# Patient Record
Sex: Female | Born: 1995 | Race: Black or African American | Hispanic: No | Marital: Married | Smoking: Current some day smoker
Health system: Southern US, Community
[De-identification: ages and names within clinical notes are randomized; demographics above are authoritative.]

## PROBLEM LIST (undated history)

## (undated) DIAGNOSIS — J45909 Unspecified asthma, uncomplicated: Secondary | ICD-10-CM

## (undated) DIAGNOSIS — E0590 Thyrotoxicosis, unspecified without thyrotoxic crisis or storm: Secondary | ICD-10-CM

## (undated) DIAGNOSIS — E079 Disorder of thyroid, unspecified: Secondary | ICD-10-CM

## (undated) HISTORY — DX: Thyrotoxicosis, unspecified without thyrotoxic crisis or storm: E05.90

## (undated) HISTORY — DX: Disorder of thyroid, unspecified: E07.9

## (undated) HISTORY — DX: Unspecified asthma, uncomplicated: J45.909

## (undated) HISTORY — PX: NO PAST SURGERIES: SHX2092

---

## 2019-03-30 ENCOUNTER — Other Ambulatory Visit: Payer: Self-pay

## 2019-03-30 ENCOUNTER — Ambulatory Visit (INDEPENDENT_AMBULATORY_CARE_PROVIDER_SITE_OTHER): Payer: Medicaid Other | Admitting: Obstetrics and Gynecology

## 2019-03-30 ENCOUNTER — Encounter: Payer: Self-pay | Admitting: Obstetrics and Gynecology

## 2019-03-30 VITALS — BP 122/80 | HR 92 | Ht 66.0 in | Wt 125.7 lb

## 2019-03-30 DIAGNOSIS — N912 Amenorrhea, unspecified: Secondary | ICD-10-CM

## 2019-03-30 NOTE — Progress Notes (Signed)
HPI:      Ms. Cindy Brock is a 23 y.o. G1P0 who LMP was Patient's last menstrual period was 02/20/2019 (approximate).  Subjective:   She presents today for pregnancy confirmation.  She was attempting pregnancy.  She is not currently taking prenatal vitamins.  She has had a previous vaginal birth and a previous first trimester miscarriage.  Her vaginal birth was uncomplicated although the baby had low birthweight.    Hx: The following portions of the patient's history were reviewed and updated as appropriate:             She  has a past medical history of Asthma and Thyroid disease. She does not have a problem list on file. She  has no past surgical history on file. Her family history includes Rheum arthritis in her mother. She  reports that she has never smoked. She has never used smokeless tobacco. She reports current alcohol use. She reports that she does not use drugs. She currently has no medications in their medication list. She has No Known Allergies.       Review of Systems:  Review of Systems  Constitutional: Denied constitutional symptoms, night sweats, recent illness, fatigue, fever, insomnia and weight loss.  Eyes: Denied eye symptoms, eye pain, photophobia, vision change and visual disturbance.  Ears/Nose/Throat/Neck: Denied ear, nose, throat or neck symptoms, hearing loss, nasal discharge, sinus congestion and sore throat.  Cardiovascular: Denied cardiovascular symptoms, arrhythmia, chest pain/pressure, edema, exercise intolerance, orthopnea and palpitations.  Respiratory: Denied pulmonary symptoms, asthma, pleuritic pain, productive sputum, cough, dyspnea and wheezing.  Gastrointestinal: Denied, gastro-esophageal reflux, melena, nausea and vomiting.  Genitourinary: Denied genitourinary symptoms including symptomatic vaginal discharge, pelvic relaxation issues, and urinary complaints.  Musculoskeletal: Denied musculoskeletal symptoms, stiffness, swelling, muscle weakness and  myalgia.  Dermatologic: Denied dermatology symptoms, rash and scar.  Neurologic: Denied neurology symptoms, dizziness, headache, neck pain and syncope.  Psychiatric: Denied psychiatric symptoms, anxiety and depression.  Endocrine: Denied endocrine symptoms including hot flashes and night sweats.   Meds:   No current outpatient medications on file prior to visit.   No current facility-administered medications on file prior to visit.     Objective:     Vitals:   03/30/19 0845  BP: 122/80  Pulse: 92              Urinary pregnancy test positive  Assessment:    G1P0 There are no active problems to display for this patient.    1. Amenorrhea        Plan:            Prenatal Plan 1.  The patient was given prenatal literature. 2.  She was begun on prenatal vitamins. 3.  A prenatal lab panel was ordered or drawn. 4.  An ultrasound was ordered to better determine an EDC. 5.  A nurse visit was scheduled. 6.  Genetic testing and testing for other inheritable conditions discussed in detail. She will decide in the future whether to have these labs performed. 7.  A general overview of pregnancy testing, visit schedule, ultrasound schedule, and prenatal care was discussed. 8.  Benefits of breast-feeding discussed in detail including both maternal and infant benefits. Ready Set Baby website discussed.   Orders No orders of the defined types were placed in this encounter.   No orders of the defined types were placed in this encounter.     F/U  No follow-ups on file. I spent 31 minutes involved in the care of this patient  of which greater than 50% was spent discussing prenatal plan as outlined above, nausea vomiting of pregnancy, breast tenderness.  All questions answered.  Elonda Husky, M.D. 03/30/2019 9:27 AM

## 2019-03-30 NOTE — Progress Notes (Signed)
Patient comes in today for pregnancy confirmation.  

## 2019-04-06 ENCOUNTER — Ambulatory Visit (INDEPENDENT_AMBULATORY_CARE_PROVIDER_SITE_OTHER): Payer: Medicaid Other

## 2019-04-06 ENCOUNTER — Other Ambulatory Visit: Payer: Self-pay

## 2019-04-06 DIAGNOSIS — O3481 Maternal care for other abnormalities of pelvic organs, first trimester: Secondary | ICD-10-CM

## 2019-04-06 DIAGNOSIS — N8312 Corpus luteum cyst of left ovary: Secondary | ICD-10-CM

## 2019-04-06 DIAGNOSIS — Z3A01 Less than 8 weeks gestation of pregnancy: Secondary | ICD-10-CM | POA: Diagnosis not present

## 2019-04-06 DIAGNOSIS — N912 Amenorrhea, unspecified: Secondary | ICD-10-CM

## 2019-04-08 ENCOUNTER — Telehealth: Payer: Self-pay

## 2019-04-08 MED ORDER — DOXYLAMINE-PYRIDOXINE 10-10 MG PO TBEC: 2.0000 | DELAYED_RELEASE_TABLET | Freq: Every day | ORAL | 5 refills | Status: DC

## 2019-04-08 NOTE — Telephone Encounter (Signed)
I have sent in diclegis to the pharmacy and notified patient.

## 2019-04-08 NOTE — Telephone Encounter (Signed)
Pt requesting nausea medication sent to [facility].

## 2019-04-11 ENCOUNTER — Telehealth: Payer: Self-pay | Admitting: Obstetrics and Gynecology

## 2019-04-11 ENCOUNTER — Telehealth: Payer: Self-pay

## 2019-04-11 DIAGNOSIS — E0590 Thyrotoxicosis, unspecified without thyrotoxic crisis or storm: Secondary | ICD-10-CM

## 2019-04-11 NOTE — Telephone Encounter (Signed)
Spoke with patient and let her know that we do not have any documentation that she has hyperthyroidism. She is going to have her previous doctor send over records. I have spoke with Sharyn Lull and she would like to get a thyroid panel on the patient. I placed the patient on hold for [place] to schedule lab and a new OB physical. Patient wants to see Midwife side.

## 2019-04-11 NOTE — Telephone Encounter (Signed)
Called patient and was telling her that the labs ordered is the thyroid panel. Phone must have disconnected because I was unable to hear the patient.

## 2019-04-11 NOTE — Telephone Encounter (Signed)
Pt states she feels she did not get the proper care and mistakes may have been made. Pt  states has been taking DICLEGIS but not working and feels it is from her hyperthyroidism that was not addressed. Pt states no one addressed her hyperthyroidism before prescribing it. CALL PT [phone].

## 2019-04-11 NOTE — Telephone Encounter (Signed)
Pt requesting a call back to confirm/explain what type of labs pt needs. Please advise.

## 2019-04-12 ENCOUNTER — Encounter: Payer: Self-pay | Admitting: Emergency Medicine

## 2019-04-12 ENCOUNTER — Emergency Department
Admission: EM | Admit: 2019-04-12 | Discharge: 2019-04-12 | Disposition: A | Discharge status: Home / Self Care | Payer: Medicaid Other | Attending: Emergency Medicine | Admitting: Emergency Medicine

## 2019-04-12 ENCOUNTER — Other Ambulatory Visit: Payer: Self-pay

## 2019-04-12 ENCOUNTER — Other Ambulatory Visit: Payer: Medicaid Other

## 2019-04-12 DIAGNOSIS — J45909 Unspecified asthma, uncomplicated: Secondary | ICD-10-CM | POA: Diagnosis not present

## 2019-04-12 DIAGNOSIS — Z3A01 Less than 8 weeks gestation of pregnancy: Secondary | ICD-10-CM | POA: Diagnosis not present

## 2019-04-12 DIAGNOSIS — E0590 Thyrotoxicosis, unspecified without thyrotoxic crisis or storm: Secondary | ICD-10-CM

## 2019-04-12 DIAGNOSIS — O219 Vomiting of pregnancy, unspecified: Secondary | ICD-10-CM | POA: Insufficient documentation

## 2019-04-12 DIAGNOSIS — R112 Nausea with vomiting, unspecified: Secondary | ICD-10-CM

## 2019-04-12 LAB — LIPASE, BLOOD: Lipase: 24 U/L (ref 11–51)

## 2019-04-12 LAB — CBC
HCT: 41 % (ref 36.0–46.0)
Hemoglobin: 13.6 g/dL (ref 12.0–15.0)
MCH: 28.8 pg (ref 26.0–34.0)
MCHC: 33.2 g/dL (ref 30.0–36.0)
MCV: 86.9 fL (ref 80.0–100.0)
Platelets: 248 10*3/uL (ref 150–400)
RBC: 4.72 MIL/uL (ref 3.87–5.11)
RDW: 12.5 % (ref 11.5–15.5)
WBC: 7.4 10*3/uL (ref 4.0–10.5)
nRBC: 0 % (ref 0.0–0.2)

## 2019-04-12 LAB — URINALYSIS, COMPLETE (UACMP) WITH MICROSCOPIC
Bilirubin Urine: NEGATIVE
Glucose, UA: NEGATIVE mg/dL
Hgb urine dipstick: NEGATIVE
Ketones, ur: NEGATIVE mg/dL
Leukocytes,Ua: NEGATIVE
Nitrite: NEGATIVE
Protein, ur: NEGATIVE mg/dL
Specific Gravity, Urine: 1.019 (ref 1.005–1.030)
pH: 6 (ref 5.0–8.0)

## 2019-04-12 LAB — COMPREHENSIVE METABOLIC PANEL
ALT: 13 U/L (ref 0–44)
AST: 18 U/L (ref 15–41)
Albumin: 4.1 g/dL (ref 3.5–5.0)
Alkaline Phosphatase: 44 U/L (ref 38–126)
Anion gap: 8 (ref 5–15)
BUN: 7 mg/dL (ref 6–20)
CO2: 24 mmol/L (ref 22–32)
Calcium: 9.3 mg/dL (ref 8.9–10.3)
Chloride: 105 mmol/L (ref 98–111)
Creatinine, Ser: 0.58 mg/dL (ref 0.44–1.00)
GFR calc Af Amer: >60 mL/min (ref >60)
GFR calc non Af Amer: >60 mL/min (ref >60)
Glucose, Bld: 99 mg/dL (ref 70–99)
Potassium: 4.2 mmol/L (ref 3.5–5.1)
Sodium: 137 mmol/L (ref 135–145)
Total Bilirubin: 1.3 mg/dL — ABNORMAL HIGH (ref 0.3–1.2)
Total Protein: 7.4 g/dL (ref 6.5–8.1)

## 2019-04-12 LAB — TSH: TSH: 0.214 u[IU]/mL — ABNORMAL LOW (ref 0.350–4.500)

## 2019-04-12 LAB — T4, FREE: Free T4: 1 ng/dL (ref 0.61–1.12)

## 2019-04-12 LAB — POCT PREGNANCY, URINE: Preg Test, Ur: POSITIVE — AB

## 2019-04-12 MED ORDER — ONDANSETRON HCL 4 MG PO TABS: 4.0000 mg | ORAL_TABLET | Freq: Every day | ORAL | 0 refills | Status: AC | PRN

## 2019-04-12 MED ORDER — SODIUM CHLORIDE 0.9 % IV BOLUS
1000.0000 mL | Freq: Once | INTRAVENOUS | Status: AC
  Administered 2019-04-12: 1000 mL via INTRAVENOUS

## 2019-04-12 MED ORDER — ONDANSETRON HCL 4 MG/2ML IJ SOLN
4.0000 mg | Freq: Once | INTRAMUSCULAR | Status: AC
  Administered 2019-04-12: 4 mg via INTRAVENOUS
  Filled 2019-04-12: qty 2

## 2019-04-12 NOTE — ED Triage Notes (Signed)
Pt had blood drawn at [facility] today to have her thyroid levels checked. Pt reports feels severely nauseated, worse than other pregnancies.

## 2019-04-12 NOTE — ED Provider Notes (Signed)
[facility] Emergency Department Provider Note  ____________________________________________   First MD Initiated Contact with Patient 04/12/19 [phone]     (approximate)  I have reviewed the triage vital signs and the nursing notes.   HISTORY  Chief Complaint Nausea    HPI Cindy Brock is a 23 y.o. female who presents from [facility] due to increasing nausea.  Patient says that this is her fourth pregnancy.  She had 2 miscarriages and one live birth.  She is [redacted] weeks pregnant she had a ultrasound confirming intrauterine pregnancy a few days ago.  No evidence of ectopic pregnancy.  Denies any lower cramping or vaginal bleeding.  She says she was having some nausea and vomiting for the past few days that is severe, constant, not better with Diclegis, worse after eating.  She is concerned that her thyroid levels have been high.          Past Medical History:  Diagnosis Date   Asthma    Thyroid disease     There are no active problems to display for this patient.   History reviewed. No pertinent surgical history.  Prior to Admission medications   Medication Sig Start Date End Date Taking? Authorizing Provider  Doxylamine-Pyridoxine (DICLEGIS) 10-10 MG TBEC Take 2 tablets by mouth at bedtime. If symptoms persist, add one tablet in the morning and one in the afternoon 04/08/19   Linzie Collin, MD    Allergies Patient has no known allergies.  Family History  Problem Relation Age of Onset   Rheum arthritis Mother     Social History Social History   Tobacco Use   Smoking status: Never Smoker   Smokeless tobacco: Never Used  Substance Use Topics   Alcohol use: Yes    Comment: social    Drug use: Never      Review of Systems Constitutional: No fever/chills Eyes: No visual changes. ENT: No sore throat. Cardiovascular: Denies chest pain. Respiratory: Denies shortness of breath. Gastrointestinal: Nausea and vomiting no  diarrhea.  No constipation. Genitourinary: Negative for dysuria. Musculoskeletal: Negative for back pain. Skin: Negative for rash. Neurological: Negative for headaches, focal weakness or numbness. All other ROS negative ____________________________________________   PHYSICAL EXAM:  VITAL SIGNS: ED Triage Vitals  Enc Vitals Group     BP 04/12/19 0845 117/77     Pulse Rate 04/12/19 0845 98     Resp 04/12/19 0845 20     Temp 04/12/19 0845 98.9 F (37.2 C)     Temp Source 04/12/19 0845 Oral     SpO2 04/12/19 0845 98 %     Weight 04/12/19 0844 125 lb (56.7 kg)     Height 04/12/19 0844 5\' 6"  (1.676 m)     Head Circumference --      Peak Flow --      Pain Score 04/12/19 0844 0     Pain Loc --      Pain Edu? --      Excl. in GC? --     Constitutional: Alert and oriented. Well appearing and in no acute distress. Eyes: Conjunctivae are normal. EOMI. Head: Atraumatic. Nose: No congestion/rhinnorhea. Mouth/Throat: Mucous membranes are moist.   Neck: No stridor. Trachea Midline. FROM Cardiovascular: Normal rate, regular rhythm. Grossly normal heart sounds.  Good peripheral circulation. Respiratory: Normal respiratory effort.  No retractions. Lungs CTAB. Gastrointestinal: Soft and nontender. No distention. No abdominal bruits.  Musculoskeletal: No lower extremity tenderness nor edema.  No joint effusions. Neurologic:  Normal  speech and language. No gross focal neurologic deficits are appreciated.  Skin:  Skin is warm, dry and intact. No rash noted. Psychiatric: Mood and affect are normal. Speech and behavior are normal. GU: Deferred   ____________________________________________   LABS (all labs ordered are listed, but only abnormal results are displayed)  Labs Reviewed  COMPREHENSIVE METABOLIC PANEL - Abnormal; Notable for the following components:      Result Value   Total Bilirubin 1.3 (*)    All other components within normal limits  URINALYSIS, COMPLETE (UACMP) WITH  MICROSCOPIC - Abnormal; Notable for the following components:   Color, Urine YELLOW (*)    APPearance CLEAR (*)    Bacteria, UA RARE (*)    All other components within normal limits  TSH - Abnormal; Notable for the following components:   TSH 0.214 (*)    All other components within normal limits  POCT PREGNANCY, URINE - Abnormal; Notable for the following components:   Preg Test, Ur POSITIVE (*)    All other components within normal limits  LIPASE, BLOOD  CBC  T4, FREE   ____________________________________________ PROCEDURES  Procedure(s) performed (including Critical Care):  Procedures  EKG my interpretation is normal sinus, no ST elevation, T wave inversion lead III, normal intervals   ____________________________________________   INITIAL IMPRESSION / ASSESSMENT AND PLAN / ED COURSE  Cindy Brock was evaluated in Emergency Department on 04/12/2019 for the symptoms described in the history of present illness. She was evaluated in the context of the global COVID-19 pandemic, which necessitated consideration that the patient might be at risk for infection with the SARS-CoV-2 virus that causes COVID-19. Institutional protocols and algorithms that pertain to the evaluation of patients at risk for COVID-19 are in a state of rapid change based on information released by regulatory bodies including the CDC and federal and state organizations. These policies and algorithms were followed during the patient's care in the ED.     Patient is a 23 year old who presents with known pregnancy and nausea and vomiting.  Will get labs to evaluate for electrolyte abnormalities, AKI, hyperemesis gravidarum.  No lower abdominal pain or vaginal bleeding to suggest ectopic pregnancy.  Patient also had an ultrasound on 1 week ago that confirmed intrauterine pregnancy.  Patient requesting her thyroid levels to be tested due to prior history of hyperthyroidism.  She has no abdominal tenderness to suggest  appendicitis or cholecystitis.  We discussed the pros and cons of Zofran and she is okay with proceeding with that.  Labs are reassuring.  Slightly elevated total bili but no abdominal pain.  Patient is known pregnancy positive.  She is no electrolyte abnormalities.  No anion gap elevation or ketones in the urine to suggest hyperemesis gravidarum.  Her TSH is slightly low but her T4 is normal though.  Patient will follows up with her primary care doctor.  10:01 AM reevaluated patient and she is feeling much better.  Patient is relieved by her normal labs.  We will give a short course of Zofran but she will follow-up with her primary care doctor to discuss further antiemetics for her pregnancy.  I discussed the provisional nature of ED diagnosis, the treatment so far, the ongoing plan of care, follow up appointments and return precautions with the patient and any family or support people present. They expressed understanding and agreed with the plan, discharged home.   ____________________________________________   FINAL CLINICAL IMPRESSION(S) / ED DIAGNOSES   Final diagnoses:  Less than [redacted] weeks gestation  of pregnancy  Intractable vomiting with nausea, unspecified vomiting type      MEDICATIONS GIVEN DURING THIS VISIT:  Medications  sodium chloride 0.9 % bolus 1,000 mL (1,000 mLs Intravenous New Bag/Given 04/12/19 0943)  ondansetron (ZOFRAN) injection 4 mg (4 mg Intravenous Given 04/12/19 0944)     ED Discharge Orders         Ordered    ondansetron (ZOFRAN) 4 MG tablet  Daily PRN     04/12/19 1003           Note:  This document was prepared using Dragon voice recognition software and may include unintentional dictation errors.   Concha SeFunke, Reba Hulett E, MD 04/12/19 1004

## 2019-04-12 NOTE — Discharge Instructions (Addendum)
Your TSH was 0.214 but your free T4 was normal.  You can follow this up with your OB doctor.  Your other labs are reassuring with no evidence of electrolyte abnormalities.  We will give a short course of Zofran you should discuss with your doctor for further management of your nausea during pregnancy.  Return to the ER for worsening nausea vomiting or any other concerns

## 2019-04-13 LAB — THYROID PANEL WITH TSH
Free Thyroxine Index: 2.6 (ref 1.2–4.9)
T3 Uptake Ratio: 28 % (ref 24–39)
T4, Total: 9.2 ug/dL (ref 4.5–12.0)
TSH: 0.287 u[IU]/mL — ABNORMAL LOW (ref 0.450–4.500)

## 2019-04-20 ENCOUNTER — Telehealth: Payer: Self-pay | Admitting: Obstetrics and Gynecology

## 2019-04-20 NOTE — Telephone Encounter (Signed)
Pt called and stated that she received a phone call from the office and wanted to speak with a nurse at the time of the phone call. I informed the patient a message will be sent back and a nurse will return her call. Pt stated she also wanted to know which provider she will see tomorrow , I also informed pt she will have an appointment with the nurse tomorrow. After reviewing apt not I informed pt she was called to be prescreened for her appointment. We only needed to ask her a few questions prior to coming in the office. Pt cut me off while I was speak and stated she wanted to reschedule for next week. I was in the process of pulling up the schedule Pt raised her voice and stated "what u go next week". I informed the patient that I was pulling up the schedule to view the next available opening and to give me one moment. Patient hung up on me. Thank you.

## 2019-04-20 NOTE — Telephone Encounter (Signed)
LM for patient to return call.

## 2019-04-21 ENCOUNTER — Other Ambulatory Visit: Payer: Self-pay

## 2019-04-21 ENCOUNTER — Ambulatory Visit: Payer: Medicaid Other | Admitting: Certified Nurse Midwife

## 2019-04-21 VITALS — BP 101/59 | HR 94 | Ht 66.0 in | Wt 134.1 lb

## 2019-04-21 DIAGNOSIS — Z3481 Encounter for supervision of other normal pregnancy, first trimester: Secondary | ICD-10-CM

## 2019-04-21 NOTE — Telephone Encounter (Signed)
Spoke with patient and she is going to come in for her appointment today. I have screened her for Covid.

## 2019-04-21 NOTE — Progress Notes (Signed)
Terrill Mohr presents for [facility] nurse interview visit. Pregnancy confirmation done 03/30/19 [facility]  G-2.  P-1 0 0 1. Pregnancy education material explained and given. 0 cats in the home. NOB labs ordered. HIV labs and Drug screen were explained optional and she did not decline. Drug screen ordered. PNV encouraged. Genetic screening options discussed. Genetic testing: unsure.  Pt may discuss with provider. Pt. To follow up with provider in _4_ weeks for NOB physical.  All questions answered.FMLA signed, financial policy reviewed.

## 2019-04-22 LAB — URINALYSIS, ROUTINE W REFLEX MICROSCOPIC
Bilirubin, UA: NEGATIVE
Glucose, UA: NEGATIVE
Ketones, UA: NEGATIVE
Leukocytes,UA: NEGATIVE
Nitrite, UA: NEGATIVE
Protein,UA: NEGATIVE
RBC, UA: NEGATIVE
Specific Gravity, UA: 1.019 (ref 1.005–1.030)
Urobilinogen, Ur: 0.2 mg/dL (ref 0.2–1.0)
pH, UA: 7.5 (ref 5.0–7.5)

## 2019-04-22 LAB — HIV ANTIBODY (ROUTINE TESTING W REFLEX): HIV Screen 4th Generation wRfx: NONREACTIVE

## 2019-04-22 LAB — ABO AND RH: Rh Factor: POSITIVE

## 2019-04-22 LAB — RUBELLA SCREEN: Rubella Antibodies, IGG: <0.9 index — ABNORMAL LOW (ref >0.99)

## 2019-04-22 LAB — RPR: RPR Ser Ql: NONREACTIVE

## 2019-04-22 LAB — HEPATITIS B SURFACE ANTIGEN: Hepatitis B Surface Ag: NEGATIVE

## 2019-04-22 LAB — ANTIBODY SCREEN: Antibody Screen: NEGATIVE

## 2019-04-22 LAB — VARICELLA ZOSTER ANTIBODY, IGG: Varicella zoster IgG: <135 index — ABNORMAL LOW (ref >165)

## 2019-04-23 LAB — URINE CULTURE: Organism ID, Bacteria: NO GROWTH

## 2019-04-24 LAB — CANNABINOID (GC/MS), URINE
Cannabinoid: POSITIVE — AB
Carboxy THC (GC/MS): >300 ng/mL

## 2019-04-24 LAB — MONITOR DRUG PROFILE 14(MW)
Amphetamine Scrn, Ur: NEGATIVE ng/mL
BARBITURATE SCREEN URINE: NEGATIVE ng/mL
BENZODIAZEPINE SCREEN, URINE: NEGATIVE ng/mL
Buprenorphine, Urine: NEGATIVE ng/mL
Cocaine (Metab) Scrn, Ur: NEGATIVE ng/mL
Creatinine(Crt), U: 79 mg/dL (ref 20.0–300.0)
Fentanyl, Urine: NEGATIVE pg/mL
Meperidine Screen, Urine: NEGATIVE ng/mL
Methadone Screen, Urine: NEGATIVE ng/mL
OXYCODONE+OXYMORPHONE UR QL SCN: NEGATIVE ng/mL
Opiate Scrn, Ur: NEGATIVE ng/mL
Ph of Urine: 7.6 (ref 4.5–8.9)
Phencyclidine Qn, Ur: NEGATIVE ng/mL
Propoxyphene Scrn, Ur: NEGATIVE ng/mL
SPECIFIC GRAVITY: 1.018
Tramadol Screen, Urine: NEGATIVE ng/mL

## 2019-04-25 LAB — GC/CHLAMYDIA PROBE AMP
Chlamydia trachomatis, NAA: NEGATIVE
Neisseria Gonorrhoeae by PCR: NEGATIVE

## 2019-04-27 ENCOUNTER — Telehealth: Payer: Self-pay

## 2019-04-27 NOTE — Telephone Encounter (Signed)
Pt called states she does not want to see midwife that she saw at last appt. Pt feels AT [facility] at last appt was racist and pt states she felt discrimination. Pt wants NOB PHY (4wk from 11/19 visit) to be with a woman of color.  Scheduled with [facility].  Please advise if appt needs to be changed.   Also pt states she was given an opoid medication for nausea at ED and they told her to fu with OBGYN to see if they need to prescribe something else. Call pt on this phone today:   [phone]

## 2019-05-02 NOTE — Telephone Encounter (Signed)
Pt has never seen AT and only seen [facility] and did a nurse intake. Please advise. Thanks [employer]

## 2019-05-03 ENCOUNTER — Other Ambulatory Visit: Payer: Self-pay

## 2019-05-03 ENCOUNTER — Telehealth: Payer: Self-pay | Admitting: Obstetrics and Gynecology

## 2019-05-03 MED ORDER — ONDANSETRON HCL 4 MG PO TABS: 4.0000 mg | ORAL_TABLET | Freq: Three times a day (TID) | ORAL | 0 refills | Status: DC | PRN

## 2019-05-03 NOTE — Telephone Encounter (Signed)
Pt states the diclegis is not working. She now has a h/a, vomiting, and nausea. She is able to hold hot tea.   Per [facility] ok to call in Zofran 4 mg until nob with [facility]. Advised pt to continue to push fluids. Asked pt to contact office in a few days for f/u on sx. If no better she will need to be seen before nob. Pt voices understanding.

## 2019-05-03 NOTE — Telephone Encounter (Signed)
Please advise. Thanks [employer]

## 2019-05-03 NOTE — Telephone Encounter (Signed)
If the pt feels more comfortable with [facility]. I think that is fine. She needs to be aware that she will see [facility] as well.   I can only find  zofran and diclegis that was rxed.  Pls contact pt and ask her which med she needs a refill of. We can refill those until visit with [facility]. TY

## 2019-05-03 NOTE — Telephone Encounter (Signed)
CM completed.

## 2019-05-03 NOTE — Telephone Encounter (Signed)
Pt called in stated she still feels sick taking the Doxylamine... pt is requesting a call from the nurse. Please advise

## 2019-05-03 NOTE — Telephone Encounter (Signed)
Please advise 

## 2019-05-03 NOTE — Telephone Encounter (Signed)
The patient called and stated that she needs to speak with a nurse in regards to the patient "feeling bad". I tried to advise the patient that a message will be sent back and a nurse will call her back as soon as they possibly can. The patient raised her voice and talked over me. It was extremely difficult to speak w/ Pt. Pt called after 4 pm trying to to come in for an appointment today 05/03/19. Pt also stated she wanted to come in on Saturday. I informed pt the office is closed on Sat-Sun. The pt was advised if she is feeling worse before a nurse can contact her that she should go to the ED. Pt continued to raise her voice and and cut me off while speaking. I apologized to patient and tried to get more information. Pt hung up on me. Please advise.

## 2019-05-17 ENCOUNTER — Encounter: Payer: Medicaid Other | Admitting: Obstetrics and Gynecology

## 2019-05-18 ENCOUNTER — Encounter: Payer: Medicaid Other | Admitting: Obstetrics and Gynecology

## 2019-06-02 ENCOUNTER — Other Ambulatory Visit: Payer: Self-pay

## 2019-06-03 NOTE — L&D Delivery Note (Addendum)
Delivery Note  In room to see patient, sitting in birthing tub. Reports pelvic pressure and the urge to push. SVE: 10/100/+2, vertex at 1745.   Effective coached maternal pushing efforts in seated position. Spontaneous vaginal birth of liveborn female infant in right occiput anterior position under water at 1753. Infant immediately to maternal chest above surface of water. Delayed cord clamping, skin to skin, and three (3) vessel cord noted. APGARs: 9, 9. Weight pending. Receiving nurse present at bedside.   Patient out of tub and to birthing bed with minimal assistance. Tube of cord blood collected. IM pitocin given, see chart. Spontaneous delivery of intact placenta at 1803. Perineum intact. Uterus firm. Rubra small. Vault check completed. Counts correct x 2. QBL 450 ml.   Initiate routine postpartum care and orders. Mom to postpartum.  Baby to Couplet care / Skin to Skin.  FOB and patient's mother present and overjoyed with the birth of "Cindy Brock".   Total time in tub: Three (3) hours 34 minutes   Serafina Royals, CNM [facility], [facility] 11/23/2019, 6:16 PM

## 2019-06-06 NOTE — Telephone Encounter (Signed)
Patient needs to be seen.

## 2019-06-09 ENCOUNTER — Encounter: Payer: Medicaid Other | Admitting: Obstetrics and Gynecology

## 2019-06-09 ENCOUNTER — Telehealth: Payer: Self-pay | Admitting: Obstetrics and Gynecology

## 2019-06-09 NOTE — Telephone Encounter (Signed)
Pt called, running 10 min behind. Was riding with Benedetto Goad and didn't have time to wait for approval to arrive late. Rescheduled patient for 07/01/19 @10 :30am. Patient wants to know if she can also have her anatomy scan on 1/29?

## 2019-06-09 NOTE — Telephone Encounter (Signed)
Pt called in wanting to cancel appt today. Pt was wanting to know if she could bring her 24 year old. I was going back to ask and when I came back to pt had hung up. I called pt back and told the pt that if she doesn't have child care that she can bring him. Pt wants to keep  appt

## 2019-06-14 ENCOUNTER — Other Ambulatory Visit: Payer: Self-pay

## 2019-06-14 ENCOUNTER — Ambulatory Visit (INDEPENDENT_AMBULATORY_CARE_PROVIDER_SITE_OTHER): Payer: Medicaid Other | Admitting: Obstetrics and Gynecology

## 2019-06-14 ENCOUNTER — Encounter: Payer: Self-pay | Admitting: Obstetrics and Gynecology

## 2019-06-14 VITALS — BP 117/79 | HR 102 | Ht 66.0 in | Wt 127.9 lb

## 2019-06-14 DIAGNOSIS — O99891 Other specified diseases and conditions complicating pregnancy: Secondary | ICD-10-CM

## 2019-06-14 DIAGNOSIS — O09892 Supervision of other high risk pregnancies, second trimester: Secondary | ICD-10-CM | POA: Diagnosis not present

## 2019-06-14 DIAGNOSIS — O99282 Endocrine, nutritional and metabolic diseases complicating pregnancy, second trimester: Secondary | ICD-10-CM | POA: Diagnosis not present

## 2019-06-14 DIAGNOSIS — F1211 Cannabis abuse, in remission: Secondary | ICD-10-CM

## 2019-06-14 DIAGNOSIS — E0590 Thyrotoxicosis, unspecified without thyrotoxic crisis or storm: Secondary | ICD-10-CM

## 2019-06-14 DIAGNOSIS — O0932 Supervision of pregnancy with insufficient antenatal care, second trimester: Secondary | ICD-10-CM | POA: Diagnosis not present

## 2019-06-14 DIAGNOSIS — Z1379 Encounter for other screening for genetic and chromosomal anomalies: Secondary | ICD-10-CM

## 2019-06-14 DIAGNOSIS — O0930 Supervision of pregnancy with insufficient antenatal care, unspecified trimester: Secondary | ICD-10-CM

## 2019-06-14 DIAGNOSIS — O99892 Other specified diseases and conditions complicating childbirth: Secondary | ICD-10-CM

## 2019-06-14 DIAGNOSIS — Z2839 Other underimmunization status: Secondary | ICD-10-CM

## 2019-06-14 DIAGNOSIS — Z3482 Encounter for supervision of other normal pregnancy, second trimester: Secondary | ICD-10-CM

## 2019-06-14 DIAGNOSIS — Z3A16 16 weeks gestation of pregnancy: Secondary | ICD-10-CM

## 2019-06-14 DIAGNOSIS — Z8709 Personal history of other diseases of the respiratory system: Secondary | ICD-10-CM

## 2019-06-14 DIAGNOSIS — Z283 Underimmunization status: Secondary | ICD-10-CM

## 2019-06-14 DIAGNOSIS — O09899 Supervision of other high risk pregnancies, unspecified trimester: Secondary | ICD-10-CM

## 2019-06-14 LAB — POCT URINALYSIS DIPSTICK OB
Bilirubin, UA: NEGATIVE
Blood, UA: NEGATIVE
Glucose, UA: NEGATIVE
Ketones, UA: NEGATIVE
Leukocytes, UA: NEGATIVE
Nitrite, UA: NEGATIVE
POC,PROTEIN,UA: NEGATIVE
Spec Grav, UA: 1.01 (ref 1.010–1.025)
Urobilinogen, UA: 0.2 E.U./dL
pH, UA: 6.5 (ref 5.0–8.0)

## 2019-06-14 NOTE — Progress Notes (Signed)
ROB-Pt present for first initial prenatal care and new ob physical. Pt stated that she was doing well no problems.

## 2019-06-14 NOTE — Progress Notes (Signed)
OBSTETRIC INITIAL PRENATAL VISIT  Subjective:    Cindy Brock is being seen today for her first obstetrical visit.  This is a planned pregnancy. She is a 24 y.o. G2P1001 female at [redacted]w[redacted]d gestation, Estimated Date of Delivery: 11/27/19 with Patient's last menstrual period was 02/20/2019 (approximate),  consistent with 6 week sono. She reports that she is late to prenatal care as she had to miss several visits due to childcare issues. Her obstetrical history is significant for smoker (marijuana prior to pregnancy but notes cessation now). Relationship with FOB: fiance. Patient does intend to breast feed, reports breastfeeding x 1 year with her last child. Pregnancy history fully reviewed. She reports nausea and vomiting have resolved.   Of note, patient repots that she is planning on a home birth. She has already obtained a doula, and is working on getting a provider. Notes that she is concerned regarding "hospital birth and the disproportionate number of women of color with having bad outcomes and dying". Also desires very low-intervention birthing methods, and does not desire vaccinations for her newborn after birth.     OB History  Gravida Para Term Preterm AB Living  2 1 1  0 0 1  SAB TAB Ectopic Multiple Live Births  0 0 0 0 1    # Outcome Date GA Lbr Len/2nd Weight Sex Delivery Anes PTL Lv  2 Current           1 Term 02/24/13   5 lb (2.268 kg)  Vag-Spont  N LIV    Gynecologic History:  Last pap smear was last year per patient (performed in 02/26/13). Results were normal.  Denies h/o abnormal pap smears in the past.  Denies history of STIs.  Contraception: None   Past Medical History:  Diagnosis Date  . Asthma   . Hyperthyroidism   . Thyroid disease     Family History  Problem Relation Age of Onset  . Rheum arthritis Mother     Past Surgical History:  Procedure Laterality Date  . NO PAST SURGERIES      Social History   Socioeconomic History  . Marital status: Single     Spouse name: Not on file  . Number of children: Not on file  . Years of education: Not on file  . Highest education level: Not on file  Occupational History  . Not on file  Tobacco Use  . Smoking status: Never Smoker  . Smokeless tobacco: Never Used  Substance and Sexual Activity  . Alcohol use: Not Currently    Comment: social   . Drug use: Never  . Sexual activity: Yes    Birth control/protection: None  Other Topics Concern  . Not on file  Social History Narrative  . Not on file   Social Determinants of Health   Financial Resource Strain:   . Difficulty of Paying Living Expenses: Not on file  Food Insecurity:   . Worried About [place] in the Last Year: Not on file  . Ran Out of Food in the Last Year: Not on file  Transportation Needs:   . Lack of Transportation (Medical): Not on file  . Lack of Transportation (Non-Medical): Not on file  Physical Activity:   . Days of Exercise per Week: Not on file  . Minutes of Exercise per Session: Not on file  Stress:   . Feeling of Stress : Not on file  Social Connections:   . Frequency of Communication with Friends and Family:  Not on file  . Frequency of Social Gatherings with Friends and Family: Not on file  . Attends Religious Services: Not on file  . Active Member of Clubs or Organizations: Not on file  . Attends Banker Meetings: Not on file  . Marital Status: Not on file  Intimate Partner Violence:   . Fear of Current or Ex-Partner: Not on file  . Emotionally Abused: Not on file  . Physically Abused: Not on file  . Sexually Abused: Not on file    Current Outpatient Medications on File Prior to Visit  Medication Sig Dispense Refill  . Prenatal MV-Min-Fe Fum-FA-DHA (PRENATAL 1 PO) Take by mouth.     No current facility-administered medications on file prior to visit.    No Known Allergies   Review of Systems General: Not Present- Fever, Weight Loss and Weight Gain. Skin: Not Present-  Rash. HEENT: Not Present- Blurred Vision, Headache and Bleeding Gums. Respiratory: Not Present- Difficulty Breathing. Breast: Not Present- Breast Mass. Cardiovascular: Not Present- Chest Pain, Elevated Blood Pressure, Fainting / Blacking Out and Shortness of Breath. Gastrointestinal: Not Present- Abdominal Pain, Constipation, Nausea and Vomiting. Female Genitourinary: Not Present- Frequency, Painful Urination, Pelvic Pain, Vaginal Bleeding, Contractions, regular, Fetal Movements Decreased, Urinary Complaints and Vaginal Fluid. Musculoskeletal: Not Present- Back Pain and Leg Cramps. Neurological: Not Present- Dizziness. Psychiatric: Not Present- Depression.     Objective:   Blood pressure 117/79, pulse (!) 102, weight 127 lb 14.4 oz (58 kg), last menstrual period 02/20/2019.  Body mass index is 20.64 kg/m.  General Appearance:    Alert, cooperative, no distress, appears stated age  Head:    Normocephalic, without obvious abnormality, atraumatic  Eyes:    PERRL, conjunctiva/corneas clear, EOM's intact, both eyes  Ears:    Normal external ear canals, both ears.  Nose:   Nares normal, septum midline, mucosa normal, no drainage or sinus tenderness  Throat:   Lips, mucosa, and tongue normal; teeth and gums normal  Neck:   Supple, symmetrical, trachea midline, no adenopathy; thyroid: no enlargement/tenderness/nodules; no carotid bruit or JVD  Back:     Symmetric, no curvature, ROM normal, no CVA tenderness  Lungs:     Clear to auscultation bilaterally, respirations unlabored  Chest Wall:    No tenderness or deformity   Heart:    Regular rate and rhythm, S1 and S2 normal, no murmur, rub or gallop  Breast Exam:    No tenderness, masses, or nipple abnormality  Abdomen:     Soft, non-tender, bowel sounds active all four quadrants, no masses, no organomegaly.  FH 16.  FHT 134  bpm.  Genitalia:    Pelvic:external genitalia normal, vagina with scant thin discharge, no tenderness, rectovaginal septum   normal. Cervix normal in appearance, no cervical motion tenderness, no adnexal masses or tenderness.  Pregnancy positive findings: uterine enlargement: 16 wk size, nontender.   Rectal:    Normal external sphincter.  No hemorrhoids appreciated. Internal exam not done.   Extremities:   Extremities normal, atraumatic, no cyanosis or edema  Pulses:   2+ and symmetric all extremities  Skin:   Skin color, texture, turgor normal, no rashes or lesions  Lymph nodes:   Cervical, supraclavicular, and axillary nodes normal  Neurologic:   CNII-XII intact, normal strength, sensation and reflexes throughout      Assessment:   1. Encounter for supervision of other normal pregnancy in second trimester   2. Genetic screening   3. Late prenatal care   4.  History of asthma   5. Hyperthyroidism affecting pregnancy in second trimester   6. Rubella non-immune status, antepartum   7. Maternal varicella, non-immune   8. Marijuana abuse in remission     Plan:   1. Supervision of normal other pregnancy  -  Initial labs reviewed. Varicella and rubella non-immune. Patient declines all vaccinations.  - Prenatal vitamins encouraged. - Problem list reviewed and updated. - New OB counseling:  The patient has been given an overview regarding routine prenatal care.  Recommendations regarding diet, weight gain, and exercise in pregnancy were given. - Prenatal testing, optional genetic testing, and ultrasound use in pregnancy were reviewed.  Patient desires cell-free DNA testing: MaterniT21 and AFP ordered. - Benefits of Breast Feeding were discussed. The patient is encouraged to consider nursing her baby post partum. - Desires home birth.  Has a doula, still looking for a provider. Discussed benefits and risks of home delivery. Discussed concerns with patient regarding hospital birth. Also given info on credible home birth providers in the surrounding areas ([facility] [place] [facility],  [facility]).  - Will get records from [place]. To sign records for medical record release for pap smear and other maintenance screenings.   2. Late prenatal care - Discussed with patient that if she has issues with child care to inform staff by next visit. Informed that this should not be a barrier for her receiving care.   3. History of asthma  - Last asthma attack 2 years ago. Has not required hospitalization. Is not using any medications. Has a rescue inhaler if needed.   4. Hyperthyroidism affecting pregnancy in second trimester - Currently on medication, but cannot recall the name. Has an Endocrinologist who is following levels.   5. Marijuana abuse in remission - Patient with history of marijuana use, + screen on NOB labs. Notes cessation.   Follow up in 4 weeks. For anatomy scan that time.   50% of 30 min visit spent on counseling and coordination of care.    The patient has Medicaid.  [facility] Medicaid Risk Screening Form completed today.     Rubie Maid, MD [facility]

## 2019-06-14 NOTE — Patient Instructions (Signed)

## 2019-06-14 NOTE — Lactation Note (Signed)
Lactation Consultation Note  Patient Name: Miaisabella Bacorn [id number] Date: 06/14/2019     Maternal Data    Feeding    LATCH Score                   Interventions    Lactation Tools Discussed/Used     Consult Status   Lactation Consultant talked with patient about the benefits of breastfeeding as well as discussed breastfeeding information per Ready, Set, Baby curriculum.       Arlyss Gandy 06/14/2019, 3:02 PM

## 2019-06-17 ENCOUNTER — Telehealth: Payer: Self-pay | Admitting: Obstetrics and Gynecology

## 2019-06-17 LAB — AFP, SERUM, OPEN SPINA BIFIDA
AFP MoM: 1.25
AFP Value: 52.2 ng/mL
Gest. Age on Collection Date: 16.2 weeks
Maternal Age At EDD: 24.5 yr
OSBR Risk 1 IN: 10000
Test Results:: NEGATIVE
Weight: 128 [lb_av]

## 2019-06-17 NOTE — Telephone Encounter (Signed)
Spoke with pt and informed her that she was not having twins and when all of her test results are in someone will contact her to go over them with her. Pt stated that she understood.

## 2019-06-17 NOTE — Telephone Encounter (Signed)
Pt called wanting to know if she was having twins?

## 2019-06-17 NOTE — Telephone Encounter (Signed)
Called pt to tell her that for her next visit we are gonna scheudle her with cherry but after we are gonmna put her with michelle pt was fine with that. We scheduled her rob visit.

## 2019-06-19 LAB — MATERNIT21  PLUS CORE+ESS+SCA, BLOOD
11q23 deletion (Jacobsen): NOT DETECTED
15q11 deletion (PW Angelman): NOT DETECTED
1p36 deletion syndrome: NOT DETECTED
22q11 deletion (DiGeorge): NOT DETECTED
4p16 deletion(Wolf-Hirschhorn): NOT DETECTED
5p15 deletion (Cri-du-chat): NOT DETECTED
8q24 deletion (Langer-Giedion): NOT DETECTED
Fetal Fraction: 11
Monosomy X (Turner Syndrome): NOT DETECTED
Result (T21): NEGATIVE
Trisomy 13 (Patau syndrome): NEGATIVE
Trisomy 16: NOT DETECTED
Trisomy 18 (Edwards syndrome): NEGATIVE
Trisomy 21 (Down syndrome): NEGATIVE
Trisomy 22: NOT DETECTED
XXX (Triple X Syndrome): NOT DETECTED
XXY (Klinefelter Syndrome): NOT DETECTED
XYY (Jacobs Syndrome): NOT DETECTED

## 2019-07-01 ENCOUNTER — Encounter: Payer: Medicaid Other | Admitting: Obstetrics and Gynecology

## 2019-07-12 ENCOUNTER — Other Ambulatory Visit: Payer: Medicaid Other

## 2019-07-12 ENCOUNTER — Encounter: Payer: Medicaid Other | Admitting: Obstetrics and Gynecology

## 2019-07-12 ENCOUNTER — Telehealth: Payer: Self-pay

## 2019-07-12 NOTE — Telephone Encounter (Signed)
Patient called in and wanted to know if her OB appts were necessary as she was paying out of pocket and doesn't want to come to visits if it's not needed. I explained that it's apart of her prenatal care once she consults with dr they decide a plan with her care which may include labs, ultrasounds, etc. Pt got angry and hung up. Patient has a extensive history of cancelling her appts.   -Cindy Brock

## 2019-07-13 NOTE — Telephone Encounter (Signed)
Left message to return call 

## 2019-07-14 ENCOUNTER — Other Ambulatory Visit: Payer: Self-pay

## 2019-07-14 ENCOUNTER — Ambulatory Visit (INDEPENDENT_AMBULATORY_CARE_PROVIDER_SITE_OTHER): Payer: Medicaid Other

## 2019-07-14 DIAGNOSIS — Z3482 Encounter for supervision of other normal pregnancy, second trimester: Secondary | ICD-10-CM | POA: Diagnosis not present

## 2019-07-15 NOTE — Telephone Encounter (Signed)
LMTRC

## 2019-07-21 NOTE — Telephone Encounter (Signed)
Appt made with JML on 2/26.  Informed pt how important it was to come to her OB visits. We want to ensure she and her baby are receiving the best care possible. Pt is reluctant but schedules.

## 2019-07-29 ENCOUNTER — Encounter: Payer: Medicaid Other | Admitting: Certified Nurse Midwife

## 2019-08-04 ENCOUNTER — Encounter: Payer: Medicaid Other | Admitting: Certified Nurse Midwife

## 2019-08-15 NOTE — Telephone Encounter (Signed)
Please see patient's mychart message regarding home birth midwives.

## 2019-08-26 ENCOUNTER — Ambulatory Visit (INDEPENDENT_AMBULATORY_CARE_PROVIDER_SITE_OTHER): Payer: Medicaid Other | Admitting: Certified Nurse Midwife

## 2019-08-26 ENCOUNTER — Other Ambulatory Visit: Payer: Self-pay

## 2019-08-26 VITALS — BP 112/66 | HR 107 | Wt 147.8 lb

## 2019-08-26 DIAGNOSIS — Z3A26 26 weeks gestation of pregnancy: Secondary | ICD-10-CM

## 2019-08-26 DIAGNOSIS — Z3482 Encounter for supervision of other normal pregnancy, second trimester: Secondary | ICD-10-CM

## 2019-08-26 NOTE — Patient Instructions (Signed)
Second Trimester of Pregnancy  The second trimester is from week 14 through week 27 (month 4 through 6). This is often the time in pregnancy that you feel your best. Often times, morning sickness has lessened or quit. You may have more energy, and you may get hungry more often. Your unborn baby is growing rapidly. At the end of the sixth month, he or she is about 9 inches long and weighs about 1 pounds. You will likely feel the baby move between 18 and 20 weeks of pregnancy. Follow these instructions at home: Medicines  Take over-the-counter and prescription medicines only as told by your doctor. Some medicines are safe and some medicines are not safe during pregnancy.  Take a prenatal vitamin that contains at least 600 micrograms (mcg) of folic acid.  If you have trouble pooping (constipation), take medicine that will make your stool soft (stool softener) if your doctor approves. Eating and drinking   Eat regular, healthy meals.  Avoid raw meat and uncooked cheese.  If you get low calcium from the food you eat, talk to your doctor about taking a daily calcium supplement.  Avoid foods that are high in fat and sugars, such as fried and sweet foods.  If you feel sick to your stomach (nauseous) or throw up (vomit): ? Eat 4 or 5 small meals a day instead of 3 large meals. ? Try eating a few soda crackers. ? Drink liquids between meals instead of during meals.  To prevent constipation: ? Eat foods that are high in fiber, like fresh fruits and vegetables, whole grains, and beans. ? Drink enough fluids to keep your pee (urine) clear or pale yellow. Activity  Exercise only as told by your doctor. Stop exercising if you start to have cramps.  Do not exercise if it is too hot, too humid, or if you are in a place of great height (high altitude).  Avoid heavy lifting.  Wear low-heeled shoes. Sit and stand up straight.  You can continue to have sex unless your doctor tells you not  to. Relieving pain and discomfort  Wear a good support bra if your breasts are tender.  Take warm water baths (sitz baths) to soothe pain or discomfort caused by hemorrhoids. Use hemorrhoid cream if your doctor approves.  Rest with your legs raised if you have leg cramps or low back pain.  If you develop puffy, bulging veins (varicose veins) in your legs: ? Wear support hose or compression stockings as told by your doctor. ? Raise (elevate) your feet for 15 minutes, 3-4 times a day. ? Limit salt in your food. Prenatal care  Write down your questions. Take them to your prenatal visits.  Keep all your prenatal visits as told by your doctor. This is important. Safety  Wear your seat belt when driving.  Make a list of emergency phone numbers, including numbers for family, friends, the hospital, and police and fire departments. General instructions  Ask your doctor about the right foods to eat or for help finding a counselor, if you need these services.  Ask your doctor about local prenatal classes. Begin classes before month 6 of your pregnancy.  Do not use hot tubs, steam rooms, or saunas.  Do not douche or use tampons or scented sanitary pads.  Do not cross your legs for long periods of time.  Visit your dentist if you have not done so. Use a soft toothbrush to brush your teeth. Floss gently.  Avoid all smoking, herbs,   and alcohol. Avoid drugs that are not approved by your doctor.  Do not use any products that contain nicotine or tobacco, such as cigarettes and e-cigarettes. If you need help quitting, ask your doctor.  Avoid cat litter boxes and soil used by cats. These carry germs that can cause birth defects in the baby and can cause a loss of your baby (miscarriage) or stillbirth. Contact a doctor if:  You have mild cramps or pressure in your lower belly.  You have pain when you pee (urinate).  You have bad smelling fluid coming from your vagina.  You continue to  feel sick to your stomach (nauseous), throw up (vomit), or have watery poop (diarrhea).  You have a nagging pain in your belly area.  You feel dizzy. Get help right away if:  You have a fever.  You are leaking fluid from your vagina.  You have spotting or bleeding from your vagina.  You have severe belly cramping or pain.  You lose or gain weight rapidly.  You have trouble catching your breath and have chest pain.  You notice sudden or extreme puffiness (swelling) of your face, hands, ankles, feet, or legs.  You have not felt the baby move in over an hour.  You have severe headaches that do not go away when you take medicine.  You have trouble seeing. Summary  The second trimester is from week 14 through week 27 (months 4 through 6). This is often the time in pregnancy that you feel your best.  To take care of yourself and your unborn baby, you will need to eat healthy meals, take medicines only if your doctor tells you to do so, and do activities that are safe for you and your baby.  Call your doctor if you get sick or if you notice anything unusual about your pregnancy. Also, call your doctor if you need help with the right food to eat, or if you want to know what activities are safe for you. This information is not intended to replace advice given to you by your health care provider. Make sure you discuss any questions you have with your health care provider. Document Revised: 09/10/2018 Document Reviewed: 06/24/2016 Elsevier Patient Education  2020 Elsevier Inc.   Fetal Movement Counts Patient Name: ________________________________________________ Patient Due Date: ____________________ What is a fetal movement count?  A fetal movement count is the number of times that you feel your baby move during a certain amount of time. This may also be called a fetal kick count. A fetal movement count is recommended for every pregnant woman. You may be asked to start counting fetal  movements as early as week 28 of your pregnancy. Pay attention to when your baby is most active. You may notice your baby's sleep and wake cycles. You may also notice things that make your baby move more. You should do a fetal movement count:  When your baby is normally most active.  At the same time each day. A good time to count movements is while you are resting, after having something to eat and drink. How do I count fetal movements? 1. Find a quiet, comfortable area. Sit, or lie down on your side. 2. Write down the date, the start time and stop time, and the number of movements that you felt between those two times. Take this information with you to your health care visits. 3. Write down your start time when you feel the first movement. 4. Count kicks, flutters, swishes,  and jabs. You should feel at least 10 movements. 5. You may stop counting after you have felt 10 movements, or if you have been counting for 2 hours. Write down the stop time. 6. If you do not feel 10 movements in 2 hours, contact your health care provider for further instructions. Your health care provider may want to do additional tests to assess your baby's well-being. Contact a health care provider if:  You feel fewer than 10 movements in 2 hours.  Your baby is not moving like he or she usually does. Date: ____________ Start time: ____________ Stop time: ____________ Movements: ____________ Date: ____________ Start time: ____________ Stop time: ____________ Movements: ____________ Date: ____________ Start time: ____________ Stop time: ____________ Movements: ____________ Date: ____________ Start time: ____________ Stop time: ____________ Movements: ____________ Date: ____________ Start time: ____________ Stop time: ____________ Movements: ____________ Date: ____________ Start time: ____________ Stop time: ____________ Movements: ____________ Date: ____________ Start time: ____________ Stop time: ____________  Movements: ____________ Date: ____________ Start time: ____________ Stop time: ____________ Movements: ____________ Date: ____________ Start time: ____________ Stop time: ____________ Movements: ____________ This information is not intended to replace advice given to you by your health care provider. Make sure you discuss any questions you have with your health care provider. Document Revised: 01/06/2019 Document Reviewed: 01/06/2019 Elsevier Patient Education  2020 Elsevier Inc.  

## 2019-08-26 NOTE — Progress Notes (Signed)
ROB- no complaints.  

## 2019-08-27 NOTE — Progress Notes (Signed)
ROB-Doing well. Planning unassisted home birth with doula. Encouraged use of skilled birth attendant. Third trimester handouts provided. Education regarding midwifery care and the hospital setting. Anticipatory guidance regarding course of prenatal care. Reviewed red flag symptoms and when to call. RTC x 2-3 weeks for 28 week labs and ROB or sooner if needed.

## 2019-09-12 ENCOUNTER — Telehealth: Payer: Self-pay | Admitting: Certified Nurse Midwife

## 2019-09-12 ENCOUNTER — Encounter: Payer: Self-pay | Admitting: Obstetrics and Gynecology

## 2019-09-12 ENCOUNTER — Other Ambulatory Visit: Payer: Self-pay

## 2019-09-12 ENCOUNTER — Observation Stay
Admission: EM | Admit: 2019-09-12 | Discharge: 2019-09-12 | Disposition: A | Discharge status: Home / Self Care | Payer: Medicaid Other | Attending: Certified Nurse Midwife | Admitting: Certified Nurse Midwife

## 2019-09-12 DIAGNOSIS — O99891 Other specified diseases and conditions complicating pregnancy: Secondary | ICD-10-CM

## 2019-09-12 DIAGNOSIS — O0930 Supervision of pregnancy with insufficient antenatal care, unspecified trimester: Secondary | ICD-10-CM

## 2019-09-12 DIAGNOSIS — Z79899 Other long term (current) drug therapy: Secondary | ICD-10-CM | POA: Diagnosis not present

## 2019-09-12 DIAGNOSIS — O09899 Supervision of other high risk pregnancies, unspecified trimester: Secondary | ICD-10-CM

## 2019-09-12 DIAGNOSIS — O99283 Endocrine, nutritional and metabolic diseases complicating pregnancy, third trimester: Secondary | ICD-10-CM | POA: Insufficient documentation

## 2019-09-12 DIAGNOSIS — O0933 Supervision of pregnancy with insufficient antenatal care, third trimester: Secondary | ICD-10-CM | POA: Diagnosis not present

## 2019-09-12 DIAGNOSIS — Z283 Underimmunization status: Secondary | ICD-10-CM

## 2019-09-12 DIAGNOSIS — E0590 Thyrotoxicosis, unspecified without thyrotoxic crisis or storm: Secondary | ICD-10-CM | POA: Diagnosis not present

## 2019-09-12 DIAGNOSIS — Z3A29 29 weeks gestation of pregnancy: Secondary | ICD-10-CM | POA: Diagnosis not present

## 2019-09-12 DIAGNOSIS — Z2839 Other underimmunization status: Secondary | ICD-10-CM

## 2019-09-12 LAB — URINALYSIS, COMPLETE (UACMP) WITH MICROSCOPIC
Bilirubin Urine: NEGATIVE
Glucose, UA: NEGATIVE mg/dL
Hgb urine dipstick: NEGATIVE
Ketones, ur: NEGATIVE mg/dL
Leukocytes,Ua: NEGATIVE
Nitrite: NEGATIVE
Protein, ur: NEGATIVE mg/dL
Specific Gravity, Urine: 1.015 (ref 1.005–1.030)
pH: 7 (ref 5.0–8.0)

## 2019-09-12 LAB — RUPTURE OF MEMBRANE (ROM)PLUS: Rom Plus: NEGATIVE

## 2019-09-12 NOTE — Telephone Encounter (Signed)
Patient called in saying that she woke up to a puddle of liquid in her bed. That she is unsure if it is amniotic fluid. Patient denies vaginal bleeding or any contractions.  Please advise.

## 2019-09-12 NOTE — Progress Notes (Signed)
Called CNM with ROM+ and UA results. States will put in discharge orders for pt and for pt to follow up in the office. Pt given discharge instructions and labor precautions. Pt verbalized understanding and had no questions.

## 2019-09-12 NOTE — Telephone Encounter (Signed)
Spoke with pt and advised that she go to the ED to be seen to make sure that the baby is doing okay.

## 2019-09-12 NOTE — Discharge Summary (Signed)
Obstetric Discharge Summary  Patient ID: Cindy Brock MRN: [id number] DOB/AGE: 06/09/1995 24 y.o.   Date of Admission: 09/12/2019  Date of Discharge:  09/12/19  Admitting Diagnosis: Observation at [redacted]w[redacted]d, rule out PPROM  Secondary Diagnosis: Late prenatal care, Limited prenatal care, Hyperthyroidism in pregnancy     Discharge Diagnosis: No other diagnosis   Antepartum Procedures: NST and ROM Plus    Brief Hospital Course   L&D OB Triage Note  Cindy Brock is a 24 y.o. G67P1021 female at [redacted]w[redacted]d, EDD Estimated Date of Delivery: 11/27/19 who presented to triage for complaints of leakage of clear fluid from vagina, possible ruptured membranes.  She was evaluated by the nurses with no significant findings for PPROM, preterm labor or fetal distress. Vital signs stable. An NST was performed and has been reviewed by CNM.   NST INTERPRETATION:  Indications: rule out uterine contractions  Mode: External Baseline Rate (A): 150 bpm(fht) Variability: Moderate Accelerations: 15 x 15 Decelerations: None Contraction Frequency (min): none noted  Impression: reactive  Recent Results (from the past 2160 hour(s))  ROM Plus ([facility] only)     Status: None   Collection Time: 09/12/19 12:24 PM  Result Value Ref Range   Rom Plus NEGATIVE     Comment: Performed at [facility], [address] [place], [place] [address]  Urinalysis, Complete w Microscopic     Status: Abnormal   Collection Time: 09/12/19 12:24 PM  Result Value Ref Range   Color, Urine YELLOW (A) YELLOW   APPearance CLOUDY (A) CLEAR   Specific Gravity, Urine 1.015 1.005 - 1.030   pH 7.0 5.0 - 8.0   Glucose, UA NEGATIVE NEGATIVE mg/dL   Hgb urine dipstick NEGATIVE NEGATIVE   Bilirubin Urine NEGATIVE NEGATIVE   Ketones, ur NEGATIVE NEGATIVE mg/dL   Protein, ur NEGATIVE NEGATIVE mg/dL   Nitrite NEGATIVE NEGATIVE   Leukocytes,Ua NEGATIVE NEGATIVE   RBC / HPF 0-5 0 - 5 RBC/hpf   WBC, UA 0-5 0 - 5 WBC/hpf   Bacteria, UA  RARE (A) NONE SEEN   Squamous Epithelial / LPF 0-5 0 - 5   Mucus PRESENT     Comment: Performed at [facility], [address], [place], [place] [address]     Plan: NST performed was reviewed and was found to be reactive. She was discharged home with bleeding/labor precautions.  Continue routine prenatal care. Encouraged to follow up with CNM in one (1) to two (2) weeks or sooner if needed.    Discharge Instructions: Per After Visit Summary.  Activity: Also refer to After Visit Summary.   Diet: Regular  Medications: Allergies as of 09/12/2019   No Known Allergies     Medication List    TAKE these medications   PRENATAL 1 PO Take by mouth.      Outpatient follow up:  Follow-up Information    Gunnar Bulla, CNM Follow up.   Specialties: Certified Nurse Midwife, Obstetrics and Gynecology, Radiology Why: Please call the office to schedule an appointment in the next one (1) to two (2) weeks.  Contact information: [address] [place] [address] [phone]           Discharged Condition: stable  Discharged to: home   Gunnar Bulla, CNM [facility], [facility] 09/12/19 1:25 PM

## 2019-09-12 NOTE — Discharge Instructions (Signed)
Fetal Movement Counts Patient Name: ________________________________________________ Patient Due Date: ____________________ What is a fetal movement count?  A fetal movement count is the number of times that you feel your baby move during a certain amount of time. This may also be called a fetal kick count. A fetal movement count is recommended for every pregnant woman. You may be asked to start counting fetal movements as early as week 28 of your pregnancy. Pay attention to when your baby is most active. You may notice your baby's sleep and wake cycles. You may also notice things that make your baby move more. You should do a fetal movement count:  When your baby is normally most active.  At the same time each day. A good time to count movements is while you are resting, after having something to eat and drink. How do I count fetal movements? 1. Find a quiet, comfortable area. Sit, or lie down on your side. 2. Write down the date, the start time and stop time, and the number of movements that you felt between those two times. Take this information with you to your health care visits. 3. Write down your start time when you feel the first movement. 4. Count kicks, flutters, swishes, rolls, and jabs. You should feel at least 10 movements. 5. You may stop counting after you have felt 10 movements, or if you have been counting for 2 hours. Write down the stop time. 6. If you do not feel 10 movements in 2 hours, contact your health care provider for further instructions. Your health care provider may want to do additional tests to assess your baby's well-being. Contact a health care provider if:  You feel fewer than 10 movements in 2 hours.  Your baby is not moving like he or she usually does. Date: ____________ Start time: ____________ Stop time: ____________ Movements: ____________ Date: ____________ Start time: ____________ Stop time: ____________ Movements: ____________ Date: ____________  Start time: ____________ Stop time: ____________ Movements: ____________ Date: ____________ Start time: ____________ Stop time: ____________ Movements: ____________ Date: ____________ Start time: ____________ Stop time: ____________ Movements: ____________ Date: ____________ Start time: ____________ Stop time: ____________ Movements: ____________ Date: ____________ Start time: ____________ Stop time: ____________ Movements: ____________ Date: ____________ Start time: ____________ Stop time: ____________ Movements: ____________ Date: ____________ Start time: ____________ Stop time: ____________ Movements: ____________ This information is not intended to replace advice given to you by your health care provider. Make sure you discuss any questions you have with your health care provider. Document Revised: 01/06/2019 Document Reviewed: 01/06/2019 [employer] Patient Education  2020 [employer]   Third Trimester of Pregnancy  The third trimester is from week 28 through week 40 (months 7 through 9). This trimester is when your unborn baby (fetus) is growing very fast. At the end of the ninth month, the unborn baby is about 20 inches in length. It weighs about 6-10 pounds. Follow these instructions at home: Medicines  Take over-the-counter and prescription medicines only as told by your doctor. Some medicines are safe and some medicines are not safe during pregnancy.  Take a prenatal vitamin that contains at least 600 micrograms (mcg) of folic acid.  If you have trouble pooping (constipation), take medicine that will make your stool soft (stool softener) if your doctor approves. Eating and drinking   Eat regular, healthy meals.  Avoid raw meat and uncooked cheese.  If you get low calcium from the food you eat, talk to your doctor about taking a daily calcium supplement.    Eat four or five small meals rather than three large meals a day.  Avoid foods that are high in fat and sugars, such as  fried and sweet foods.  To prevent constipation: ? Eat foods that are high in fiber, like fresh fruits and vegetables, whole grains, and beans. ? Drink enough fluids to keep your pee (urine) clear or pale yellow. Activity  Exercise only as told by your doctor. Stop exercising if you start to have cramps.  Avoid heavy lifting, wear low heels, and sit up straight.  Do not exercise if it is too hot, too humid, or if you are in a place of great height (high altitude).  You may continue to have sex unless your doctor tells you not to. Relieving pain and discomfort  Wear a good support bra if your breasts are tender.  Take frequent breaks and rest with your legs raised if you have leg cramps or low back pain.  Take warm water baths (sitz baths) to soothe pain or discomfort caused by hemorrhoids. Use hemorrhoid cream if your doctor approves.  If you develop puffy, bulging veins (varicose veins) in your legs: ? Wear support hose or compression stockings as told by your doctor. ? Raise (elevate) your feet for 15 minutes, 3-4 times a day. ? Limit salt in your food. Safety  Wear your seat belt when driving.  Make a list of emergency phone numbers, including numbers for family, friends, the hospital, and police and fire departments. Preparing for your baby's arrival To prepare for the arrival of your baby:  Take prenatal classes.  Practice driving to the hospital.  Visit the hospital and tour the maternity area.  Talk to your work about taking leave once the baby comes.  Pack your hospital bag.  Prepare the baby's room.  Go to your doctor visits.  Buy a rear-facing car seat. Learn how to install it in your car. General instructions  Do not use hot tubs, steam rooms, or saunas.  Do not use any products that contain nicotine or tobacco, such as cigarettes and e-cigarettes. If you need help quitting, ask your doctor.  Do not drink alcohol.  Do not douche or use tampons or  scented sanitary pads.  Do not cross your legs for long periods of time.  Do not travel for long distances unless you must. Only do so if your doctor says it is okay.  Visit your dentist if you have not gone during your pregnancy. Use a soft toothbrush to brush your teeth. Be gentle when you floss.  Avoid cat litter boxes and soil used by cats. These carry germs that can cause birth defects in the baby and can cause a loss of your baby (miscarriage) or stillbirth.  Keep all your prenatal visits as told by your doctor. This is important. Contact a doctor if:  You are not sure if you are in labor or if your water has broken.  You are dizzy.  You have mild cramps or pressure in your lower belly.  You have a nagging pain in your belly area.  You continue to feel sick to your stomach, you throw up, or you have watery poop.  You have bad smelling fluid coming from your vagina.  You have pain when you pee. Get help right away if:  You have a fever.  You are leaking fluid from your vagina.  You are spotting or bleeding from your vagina.  You have severe belly cramps or pain.  You   lose or gain weight quickly.  You have trouble catching your breath and have chest pain.  You notice sudden or extreme puffiness (swelling) of your face, hands, ankles, feet, or legs.  You have not felt the baby move in over an hour.  You have severe headaches that do not go away with medicine.  You have trouble seeing.  You are leaking, or you are having a gush of fluid, from your vagina before you are 37 weeks.  You have regular belly spasms (contractions) before you are 37 weeks. Summary  The third trimester is from week 28 through week 40 (months 7 through 9). This time is when your unborn baby is growing very fast.  Follow your doctor's advice about medicine, food, and activity.  Get ready for the arrival of your baby by taking prenatal classes, getting all the baby items ready,  preparing the baby's room, and visiting your doctor to be checked.  Get help right away if you are bleeding from your vagina, or you have chest pain and trouble catching your breath, or if you have not felt your baby move in over an hour. This information is not intended to replace advice given to you by your health care provider. Make sure you discuss any questions you have with your health care provider. Document Revised: 09/09/2018 Document Reviewed: 06/24/2016 Else[employer] Patient Education  2020 [employer]  

## 2019-09-12 NOTE — OB Triage Note (Signed)
Pt [id number] at [redacted]w[redacted]d presents with c/o LOF. Pt noticed clear fluid on her bed sheet and pillow when she woke up at 7am. When pt stood up there was a puddle on the floor. Denies CTX/pain/bleeding. Pt has not noticed any LOF/discharge since. Pt reports she has a doula and is planning on having a home birth. Pt has no other concerns. VSS. Monitors applied. Will continue to monitor.

## 2019-09-13 LAB — URINE CULTURE: Culture: NO GROWTH

## 2019-10-03 ENCOUNTER — Telehealth: Payer: Self-pay | Admitting: Certified Nurse Midwife

## 2019-10-03 ENCOUNTER — Ambulatory Visit (INDEPENDENT_AMBULATORY_CARE_PROVIDER_SITE_OTHER): Payer: Medicaid Other | Admitting: Certified Nurse Midwife

## 2019-10-03 ENCOUNTER — Encounter: Payer: Self-pay | Admitting: Certified Nurse Midwife

## 2019-10-03 ENCOUNTER — Other Ambulatory Visit: Payer: Self-pay

## 2019-10-03 VITALS — BP 100/78 | HR 102 | Wt 148.8 lb

## 2019-10-03 DIAGNOSIS — M549 Dorsalgia, unspecified: Secondary | ICD-10-CM | POA: Insufficient documentation

## 2019-10-03 DIAGNOSIS — G4700 Insomnia, unspecified: Secondary | ICD-10-CM

## 2019-10-03 DIAGNOSIS — Z2821 Immunization not carried out because of patient refusal: Secondary | ICD-10-CM | POA: Insufficient documentation

## 2019-10-03 DIAGNOSIS — Z131 Encounter for screening for diabetes mellitus: Secondary | ICD-10-CM

## 2019-10-03 DIAGNOSIS — O99891 Other specified diseases and conditions complicating pregnancy: Secondary | ICD-10-CM

## 2019-10-03 DIAGNOSIS — O99282 Endocrine, nutritional and metabolic diseases complicating pregnancy, second trimester: Secondary | ICD-10-CM

## 2019-10-03 DIAGNOSIS — Z130 Encounter for screening for diseases of the blood and blood-forming organs and certain disorders involving the immune mechanism: Secondary | ICD-10-CM

## 2019-10-03 DIAGNOSIS — E0590 Thyrotoxicosis, unspecified without thyrotoxic crisis or storm: Secondary | ICD-10-CM

## 2019-10-03 DIAGNOSIS — Z3A32 32 weeks gestation of pregnancy: Secondary | ICD-10-CM

## 2019-10-03 DIAGNOSIS — O26813 Pregnancy related exhaustion and fatigue, third trimester: Secondary | ICD-10-CM

## 2019-10-03 DIAGNOSIS — Z3483 Encounter for supervision of other normal pregnancy, third trimester: Secondary | ICD-10-CM

## 2019-10-03 DIAGNOSIS — Z113 Encounter for screening for infections with a predominantly sexual mode of transmission: Secondary | ICD-10-CM

## 2019-10-03 MED ORDER — ALBUTEROL SULFATE HFA 108 (90 BASE) MCG/ACT IN AERS: 2.0000 | INHALATION_SPRAY | Freq: Four times a day (QID) | RESPIRATORY_TRACT | 0 refills | Status: DC | PRN

## 2019-10-03 NOTE — Addendum Note (Signed)
Addended by: Shaune Spittle on: 10/03/2019 01:01 PM   Modules accepted: Orders

## 2019-10-03 NOTE — Telephone Encounter (Signed)
I already sent you a message to schedule an appointment for her today. Thanks, JML

## 2019-10-03 NOTE — Patient Instructions (Addendum)
Back Pain in Pregnancy Back pain during pregnancy is common. Back pain may be caused by several factors that are related to changes during your pregnancy. Follow these instructions at home: Managing pain, stiffness, and swelling      If directed, for sudden (acute) back pain, put ice on the painful area. ? Put ice in a plastic bag. ? Place a towel between your skin and the bag. ? Leave the ice on for 20 minutes, 2-3 times per day.  If directed, apply heat to the affected area before you exercise. Use the heat source that your health care provider recommends, such as a moist heat pack or a heating pad. ? Place a towel between your skin and the heat source. ? Leave the heat on for 20-30 minutes. ? Remove the heat if your skin turns bright red. This is especially important if you are unable to feel pain, heat, or cold. You may have a greater risk of getting burned.  If directed, massage the affected area. Activity  Exercise as told by your health care provider. Gentle exercise is the best way to prevent or manage back pain.  Listen to your body when lifting. If lifting hurts, ask for help or bend your knees. This uses your leg muscles instead of your back muscles.  Squat down when picking up something from the floor. Do not bend over.  Only use bed rest for short periods as told by your health care provider. Bed rest should only be used for the most severe episodes of back pain. Standing, sitting, and lying down  Do not stand in one place for long periods of time.  Use good posture when sitting. Make sure your head rests over your shoulders and is not hanging forward. Use a pillow on your lower back if necessary.  Try sleeping on your side, preferably the left side, with a pregnancy support pillow or 1-2 regular pillows between your legs. ? If you have back pain after a night's rest, your bed may be too soft. ? A firm mattress may provide more support for your back during  pregnancy. General instructions  Do not wear high heels.  Eat a healthy diet. Try to gain weight within your health care provider's recommendations.  Use a maternity girdle, elastic sling, or back brace as told by your health care provider.  Take over-the-counter and prescription medicines only as told by your health care provider.  Work with a physical therapist or massage therapist to find ways to manage back pain. Acupuncture or massage therapy may be helpful.  Keep all follow-up visits as told by your health care provider. This is important. Contact a health care provider if:  Your back pain interferes with your daily activities.  You have increasing pain in other parts of your body. Get help right away if:  You develop numbness, tingling, weakness, or problems with the use of your arms or legs.  You develop severe back pain that is not controlled with medicine.  You have a change in bowel or bladder control.  You develop shortness of breath, dizziness, or you faint.  You develop nausea, vomiting, or sweating.  You have back pain that is a rhythmic, cramping pain similar to labor pains. Labor pain is usually 1-2 minutes apart, lasts for about 1 minute, and involves a bearing down feeling or pressure in your pelvis.  You have back pain and your water breaks or you have vaginal bleeding.  You have back pain or numbness  that travels down your leg.  Your back pain developed after you fell.  You develop pain on one side of your back.  You see blood in your urine.  You develop skin blisters in the area of your back pain. Summary  Back pain may be caused by several factors that are related to changes during your pregnancy.  Follow instructions as told by your health care provider for managing pain, stiffness, and swelling.  Exercise as told by your health care provider. Gentle exercise is the best way to prevent or manage back pain.  Take over-the-counter and  prescription medicines only as told by your health care provider.  Keep all follow-up visits as told by your health care provider. This is important. This information is not intended to replace advice given to you by your health care provider. Make sure you discuss any questions you have with your health care provider. Document Revised: 09/07/2018 Document Reviewed: 11/04/2017 Elsevier Patient Education  [place] of Pregnancy  The third trimester is from week 28 through week 40 (months 7 through 9). This trimester is when your unborn baby (fetus) is growing very fast. At the end of the ninth month, the unborn baby is about 20 inches in length. It weighs about 6-10 pounds. Follow these instructions at home: Medicines  Take over-the-counter and prescription medicines only as told by your doctor. Some medicines are safe and some medicines are not safe during pregnancy.  Take a prenatal vitamin that contains at least 600 micrograms (mcg) of folic acid.  If you have trouble pooping (constipation), take medicine that will make your stool soft (stool softener) if your doctor approves. Eating and drinking   Eat regular, healthy meals.  Avoid raw meat and uncooked cheese.  If you get low calcium from the food you eat, talk to your doctor about taking a daily calcium supplement.  Eat four or five small meals rather than three large meals a day.  Avoid foods that are high in fat and sugars, such as fried and sweet foods.  To prevent constipation: ? Eat foods that are high in fiber, like fresh fruits and vegetables, whole grains, and beans. ? Drink enough fluids to keep your pee (urine) clear or pale yellow. Activity  Exercise only as told by your doctor. Stop exercising if you start to have cramps.  Avoid heavy lifting, wear low heels, and sit up straight.  Do not exercise if it is too hot, too humid, or if you are in a place of great height (high  altitude).  You may continue to have sex unless your doctor tells you not to. Relieving pain and discomfort  Wear a good support bra if your breasts are tender.  Take frequent breaks and rest with your legs raised if you have leg cramps or low back pain.  Take warm water baths (sitz baths) to soothe pain or discomfort caused by hemorrhoids. Use hemorrhoid cream if your doctor approves.  If you develop puffy, bulging veins (varicose veins) in your legs: ? Wear support hose or compression stockings as told by your doctor. ? Raise (elevate) your feet for 15 minutes, 3-4 times a day. ? Limit salt in your food. Safety  Wear your seat belt when driving.  Make a list of emergency phone numbers, including numbers for family, friends, the hospital, and police and fire departments. Preparing for your baby's arrival To prepare for the arrival of your baby:  Take prenatal classes.  Practice driving to the hospital.  Visit the hospital and tour the maternity area.  Talk to your work about taking leave once the baby comes.  Pack your hospital bag.  Prepare the baby's room.  Go to your doctor visits.  Buy a rear-facing car seat. Learn how to install it in your car. General instructions  Do not use hot tubs, steam rooms, or saunas.  Do not use any products that contain nicotine or tobacco, such as cigarettes and e-cigarettes. If you need help quitting, ask your doctor.  Do not drink alcohol.  Do not douche or use tampons or scented sanitary pads.  Do not cross your legs for long periods of time.  Do not travel for long distances unless you must. Only do so if your doctor says it is okay.  Visit your dentist if you have not gone during your pregnancy. Use a soft toothbrush to brush your teeth. Be gentle when you floss.  Avoid cat litter boxes and soil used by cats. These carry germs that can cause birth defects in the baby and can cause a loss of your baby (miscarriage) or  stillbirth.  Keep all your prenatal visits as told by your doctor. This is important. Contact a doctor if:  You are not sure if you are in labor or if your water has broken.  You are dizzy.  You have mild cramps or pressure in your lower belly.  You have a nagging pain in your belly area.  You continue to feel sick to your stomach, you throw up, or you have watery poop.  You have bad smelling fluid coming from your vagina.  You have pain when you pee. Get help right away if:  You have a fever.  You are leaking fluid from your vagina.  You are spotting or bleeding from your vagina.  You have severe belly cramps or pain.  You lose or gain weight quickly.  You have trouble catching your breath and have chest pain.  You notice sudden or extreme puffiness (swelling) of your face, hands, ankles, feet, or legs.  You have not felt the baby move in over an hour.  You have severe headaches that do not go away with medicine.  You have trouble seeing.  You are leaking, or you are having a gush of fluid, from your vagina before you are 37 weeks.  You have regular belly spasms (contractions) before you are 37 weeks. Summary  The third trimester is from week 28 through week 40 (months 7 through 9). This time is when your unborn baby is growing very fast.  Follow your doctor's advice about medicine, food, and activity.  Get ready for the arrival of your baby by taking prenatal classes, getting all the baby items ready, preparing the baby's room, and visiting your doctor to be checked.  Get help right away if you are bleeding from your vagina, or you have chest pain and trouble catching your breath, or if you have not felt your baby move in over an hour. This information is not intended to replace advice given to you by your health care provider. Make sure you discuss any questions you have with your health care provider. Document Revised: 09/09/2018 Document Reviewed:  06/24/2016 Elsevier Patient Education  2020 [place].    Fetal Movement Counts Patient Name: ________________________________________________ Patient Due Date: ____________________ What is a fetal movement count?  A fetal movement count is the number of times that you feel your baby move  during a certain amount of time. This may also be called a fetal kick count. A fetal movement count is recommended for every pregnant woman. You may be asked to start counting fetal movements as early as week 28 of your pregnancy. Pay attention to when your baby is most active. You may notice your baby's sleep and wake cycles. You may also notice things that make your baby move more. You should do a fetal movement count:  When your baby is normally most active.  At the same time each day. A good time to count movements is while you are resting, after having something to eat and drink. How do I count fetal movements? 1. Find a quiet, comfortable area. Sit, or lie down on your side. 2. Write down the date, the start time and stop time, and the number of movements that you felt between those two times. Take this information with you to your health care visits. 3. Write down your start time when you feel the first movement. 4. Count kicks, flutters, swishes, rolls, and jabs. You should feel at least 10 movements. 5. You may stop counting after you have felt 10 movements, or if you have been counting for 2 hours. Write down the stop time. 6. If you do not feel 10 movements in 2 hours, contact your health care provider for further instructions. Your health care provider may want to do additional tests to assess your baby's well-being. Contact a health care provider if:  You feel fewer than 10 movements in 2 hours.  Your baby is not moving like he or she usually does. Date: ____________ Start time: ____________ Stop time: ____________ Movements: ____________ Date: ____________ Start time: ____________ Stop  time: ____________ Movements: ____________ Date: ____________ Start time: ____________ Stop time: ____________ Movements: ____________ Date: ____________ Start time: ____________ Stop time: ____________ Movements: ____________ Date: ____________ Start time: ____________ Stop time: ____________ Movements: ____________ Date: ____________ Start time: ____________ Stop time: ____________ Movements: ____________ Date: ____________ Start time: ____________ Stop time: ____________ Movements: ____________ Date: ____________ Start time: ____________ Stop time: ____________ Movements: ____________ Date: ____________ Start time: ____________ Stop time: ____________ Movements: ____________ This information is not intended to replace advice given to you by your health care provider. Make sure you discuss any questions you have with your health care provider. Document Revised: 01/06/2019 Document Reviewed: 01/06/2019 Elsevier Patient Education  [place].    Common Medications Safe in Pregnancy  Acne:      Constipation:  Benzoyl Peroxide     Colace  Clindamycin      Dulcolax Suppository  Topica Erythromycin     Fibercon  Salicylic Acid      Metamucil         Miralax AVOID:        Senakot   Accutane    Cough:  Retin-A       Cough Drops  Tetracycline      Phenergan w/ Codeine if Rx  Minocycline      Robitussin (Plain & DM)  Antibiotics:     Crabs/Lice:  Ceclor       RID  Cephalosporins    AVOID:  E-Mycins      Kwell  Keflex  Macrobid/Macrodantin   Diarrhea:  Penicillin      Kao-Pectate  Zithromax      Imodium AD         PUSH FLUIDS AVOID:       Cipro     Fever:  Tetracycline  Tylenol (Regular or Extra  Minocycline       Strength)  Levaquin      Extra Strength-Do not          Exceed 8 tabs/24 hrs Caffeine:        <271m/day (equiv. To 1 cup of coffee or  approx. 3 12 oz  sodas)         Gas: Cold/Hayfever:       Gas-X  Benadryl      Mylicon  Claritin       Phazyme  **Claritin-D        Chlor-Trimeton    Headaches:  Dimetapp      ASA-Free Excedrin  Drixoral-Non-Drowsy     Cold Compress  Mucinex (Guaifenasin)     Tylenol (Regular or Extra  Sudafed/Sudafed-12 Hour     Strength)  **Sudafed PE Pseudoephedrine   Tylenol Cold & Sinus     Vicks Vapor Rub  Zyrtec  **AVOID if Problems With Blood Pressure         Heartburn: Avoid lying down for at least 1 hour after meals  Aciphex      Maalox     Rash:  Milk of Magnesia     Benadryl    Mylanta       1% Hydrocortisone Cream  Pepcid  Pepcid Complete   Sleep Aids:  Prevacid      Ambien   Prilosec       Benadryl  Rolaids       Chamomile Tea  Tums (Limit 4/day)     Unisom  Zantac       Tylenol PM         Warm milk-add vanilla or  Hemorrhoids:       Sugar for taste  Anusol/Anusol H.C.  (RX: Analapram 2.5%)  Sugar Substitutes:  Hydrocortisone OTC     Ok in moderation  Preparation H      Tucks        Vaseline lotion applied to tissue with wiping    Herpes:     Throat:  Acyclovir      Oragel  Famvir  Valtrex     Vaccines:         Flu Shot Leg Cramps:       *Gardasil  Benadryl      Hepatitis A         Hepatitis B Nasal Spray:       Pneumovax  Saline Nasal Spray     Polio Booster         Tetanus Nausea:       Tuberculosis test or PPD  Vitamin B6 25 mg TID   AVOID:    Dramamine      *Gardasil  Emetrol       Live Poliovirus  Ginger Root 250 mg QID    MMR (measles, mumps &  High Complex Carbs @ Bedtime    rebella)  Sea Bands-Accupressure    Varicella (Chickenpox)  Unisom 1/2 tab TID     *No known complications           If received before Pain:         Known pregnancy;   Darvocet       Resume series after  Lortab        Delivery  Percocet    Yeast:   Tramadol      Femstat  Tylenol 3      Gyne-lotrimin  Ultram       Monistat  Vicodin           MISC:         All  Sunscreens           Hair Coloring/highlights          Insect Repellant's          (Including DEET)         Mystic Tans

## 2019-10-03 NOTE — Progress Notes (Signed)
I have seen, interviewed, and examined the patient in conjunction with the [employer] Nurse Practitioner student and affirm the diagnosis and management plan.   Gunnar Bulla, CNM [facility], [facility] 10/03/19 12:29 PM

## 2019-10-03 NOTE — Progress Notes (Signed)
ROB-Pt present for routine prenatal care. Pt c/o body aches, unable to sleep and being tired all of the time.

## 2019-10-03 NOTE — Telephone Encounter (Signed)
Patient called in saying she wasn't feeling well, she is having difficulty sleeping. Patient denied any vaginal bleeding, contractions, etc. Could you please advise?

## 2019-10-03 NOTE — Telephone Encounter (Signed)
Please contact patient and schedule appointment for today as requested. Thanks, JML

## 2019-10-03 NOTE — Progress Notes (Signed)
ROB- Doing well. Was planning unassisted home birth with doula but has now agreed to come in for prenatal care and desires water birth. Handouts given. Twenty-eight (28) weeks labs done today. See orders. Reports back and neck pain and fatigue and increased inhaler use, referral to physical therapy and albuterol refill given. Anticipatory guidance given. See orders. Reviewed red flag symptoms and when to call the office.  RTC x 2 weeks or sooner if needed  Glorious Peach RN [facility] [employer] [facility] 10/03/19 12:23 PM

## 2019-10-04 LAB — CBC
Hematocrit: 36.9 % (ref 34.0–46.6)
Hemoglobin: 12.3 g/dL (ref 11.1–15.9)
MCH: 28.1 pg (ref 26.6–33.0)
MCHC: 33.3 g/dL (ref 31.5–35.7)
MCV: 84 fL (ref 79–97)
Platelets: 255 10*3/uL (ref 150–450)
RBC: 4.37 x10E6/uL (ref 3.77–5.28)
RDW: 12.8 % (ref 11.7–15.4)
WBC: 13.7 10*3/uL — ABNORMAL HIGH (ref 3.4–10.8)

## 2019-10-04 LAB — THYROID PANEL WITH TSH
Free Thyroxine Index: 2.1 (ref 1.2–4.9)
T3 Uptake Ratio: 14 % — ABNORMAL LOW (ref 24–39)
T4, Total: 14.7 ug/dL — ABNORMAL HIGH (ref 4.5–12.0)
TSH: 0.092 u[IU]/mL — ABNORMAL LOW (ref 0.450–4.500)

## 2019-10-04 LAB — RPR: RPR Ser Ql: NONREACTIVE

## 2019-10-04 LAB — HEMOGLOBIN A1C
Est. average glucose Bld gHb Est-mCnc: 111 mg/dL
Hgb A1c MFr Bld: 5.5 % (ref 4.8–5.6)

## 2019-10-04 LAB — VITAMIN D 25 HYDROXY (VIT D DEFICIENCY, FRACTURES): Vit D, 25-Hydroxy: 52 ng/mL (ref 30.0–100.0)

## 2019-10-06 ENCOUNTER — Ambulatory Visit (INDEPENDENT_AMBULATORY_CARE_PROVIDER_SITE_OTHER): Payer: Medicaid Other | Admitting: Certified Nurse Midwife

## 2019-10-06 ENCOUNTER — Other Ambulatory Visit: Payer: Self-pay

## 2019-10-06 ENCOUNTER — Encounter: Payer: Self-pay | Admitting: Certified Nurse Midwife

## 2019-10-06 VITALS — BP 102/72 | HR 112

## 2019-10-06 DIAGNOSIS — O26893 Other specified pregnancy related conditions, third trimester: Secondary | ICD-10-CM

## 2019-10-06 DIAGNOSIS — R102 Pelvic and perineal pain unspecified side: Secondary | ICD-10-CM

## 2019-10-06 DIAGNOSIS — O99283 Endocrine, nutritional and metabolic diseases complicating pregnancy, third trimester: Secondary | ICD-10-CM

## 2019-10-06 DIAGNOSIS — Z20828 Contact with and (suspected) exposure to other viral communicable diseases: Secondary | ICD-10-CM

## 2019-10-06 DIAGNOSIS — Z3483 Encounter for supervision of other normal pregnancy, third trimester: Secondary | ICD-10-CM

## 2019-10-06 DIAGNOSIS — Z3A32 32 weeks gestation of pregnancy: Secondary | ICD-10-CM

## 2019-10-06 DIAGNOSIS — E0590 Thyrotoxicosis, unspecified without thyrotoxic crisis or storm: Secondary | ICD-10-CM

## 2019-10-06 DIAGNOSIS — N898 Other specified noninflammatory disorders of vagina: Secondary | ICD-10-CM

## 2019-10-06 LAB — POCT URINALYSIS DIPSTICK OB
Bilirubin, UA: NEGATIVE
Blood, UA: NEGATIVE
Glucose, UA: NEGATIVE
Ketones, UA: NEGATIVE
Leukocytes, UA: NEGATIVE
Nitrite, UA: NEGATIVE
Spec Grav, UA: 1.015 (ref 1.010–1.025)
Urobilinogen, UA: 0.2 E.U./dL
pH, UA: 7 (ref 5.0–8.0)

## 2019-10-06 MED ORDER — OSELTAMIVIR PHOSPHATE 75 MG PO CAPS: 75.0000 mg | ORAL_CAPSULE | Freq: Every day | ORAL | 0 refills | Status: DC

## 2019-10-06 NOTE — Progress Notes (Signed)
ROB-pt present c/o of vaginal soreness.

## 2019-10-06 NOTE — Progress Notes (Signed)
Patient left prior to being seen by CNM. Results sent via MyChart with medication and care recommendation. Reviewed red flag symptoms and encouraged to continue prenatal care.

## 2019-10-17 ENCOUNTER — Encounter: Payer: Self-pay | Admitting: Certified Nurse Midwife

## 2019-10-17 ENCOUNTER — Other Ambulatory Visit: Payer: Self-pay

## 2019-10-17 ENCOUNTER — Ambulatory Visit (INDEPENDENT_AMBULATORY_CARE_PROVIDER_SITE_OTHER): Payer: Medicaid Other | Admitting: Certified Nurse Midwife

## 2019-10-17 VITALS — BP 99/73 | HR 120 | Wt 155.2 lb

## 2019-10-17 DIAGNOSIS — O99283 Endocrine, nutritional and metabolic diseases complicating pregnancy, third trimester: Secondary | ICD-10-CM

## 2019-10-17 DIAGNOSIS — Z3483 Encounter for supervision of other normal pregnancy, third trimester: Secondary | ICD-10-CM

## 2019-10-17 DIAGNOSIS — Z3A34 34 weeks gestation of pregnancy: Secondary | ICD-10-CM

## 2019-10-17 DIAGNOSIS — M549 Dorsalgia, unspecified: Secondary | ICD-10-CM

## 2019-10-17 DIAGNOSIS — E0590 Thyrotoxicosis, unspecified without thyrotoxic crisis or storm: Secondary | ICD-10-CM

## 2019-10-17 DIAGNOSIS — O99891 Other specified diseases and conditions complicating pregnancy: Secondary | ICD-10-CM

## 2019-10-17 NOTE — Progress Notes (Signed)
Patient comes in today for ROB visit. Patient has no concerns today.  °

## 2019-10-17 NOTE — Progress Notes (Signed)
ROB- Doing well. Reviewed thyroid labs today. Discussed ultrasound scheduled for Monday and advised to schedule ROB after. Reports difficulty signing up for the Water Birth class but has rented the tub. Marcelino Duster CNM provided instructions on how to get new link. Would like referral to PT for back pain. Anticipatory guidance given. Reviewed red flags and when to call the office.  RTC x 1 week for growth scan and ROB or sooner if needed.   Glorious Peach RN [facility] [employer] 10/17/19 12:16 PM

## 2019-10-17 NOTE — Progress Notes (Signed)
I have seen, interviewed, and examined the patient in conjunction with the [employer] Nurse Practitioner student and affirm the diagnosis and management plan.   Gunnar Bulla, CNM [facility], [facility] 10/17/19 4:47 PM

## 2019-10-17 NOTE — Patient Instructions (Signed)
Fetal Movement Counts Patient Name: ________________________________________________ Patient Due Date: ____________________ What is a fetal movement count?  A fetal movement count is the number of times that you feel your baby move during a certain amount of time. This may also be called a fetal kick count. A fetal movement count is recommended for every pregnant woman. You may be asked to start counting fetal movements as early as week 28 of your pregnancy. Pay attention to when your baby is most active. You may notice your baby's sleep and wake cycles. You may also notice things that make your baby move more. You should do a fetal movement count:  When your baby is normally most active.  At the same time each day. A good time to count movements is while you are resting, after having something to eat and drink. How do I count fetal movements? 1. Find a quiet, comfortable area. Sit, or lie down on your side. 2. Write down the date, the start time and stop time, and the number of movements that you felt between those two times. Take this information with you to your health care visits. 3. Write down your start time when you feel the first movement. 4. Count kicks, flutters, swishes, rolls, and jabs. You should feel at least 10 movements. 5. You may stop counting after you have felt 10 movements, or if you have been counting for 2 hours. Write down the stop time. 6. If you do not feel 10 movements in 2 hours, contact your health care provider for further instructions. Your health care provider may want to do additional tests to assess your baby's well-being. Contact a health care provider if:  You feel fewer than 10 movements in 2 hours.  Your baby is not moving like he or she usually does. Date: ____________ Start time: ____________ Stop time: ____________ Movements: ____________ Date: ____________ Start time: ____________ Stop time: ____________ Movements: ____________ Date: ____________  Start time: ____________ Stop time: ____________ Movements: ____________ Date: ____________ Start time: ____________ Stop time: ____________ Movements: ____________ Date: ____________ Start time: ____________ Stop time: ____________ Movements: ____________ Date: ____________ Start time: ____________ Stop time: ____________ Movements: ____________ Date: ____________ Start time: ____________ Stop time: ____________ Movements: ____________ Date: ____________ Start time: ____________ Stop time: ____________ Movements: ____________ Date: ____________ Start time: ____________ Stop time: ____________ Movements: ____________ This information is not intended to replace advice given to you by your health care provider. Make sure you discuss any questions you have with your health care provider. Document Revised: 01/06/2019 Document Reviewed: 01/06/2019 Elsevier Patient Education  2020 Elsevier Inc.  

## 2019-10-20 NOTE — Telephone Encounter (Signed)
Completed. Patient was scheduled

## 2019-10-24 ENCOUNTER — Ambulatory Visit (INDEPENDENT_AMBULATORY_CARE_PROVIDER_SITE_OTHER): Payer: Medicaid Other

## 2019-10-24 ENCOUNTER — Ambulatory Visit (INDEPENDENT_AMBULATORY_CARE_PROVIDER_SITE_OTHER): Payer: Medicaid Other | Admitting: Certified Nurse Midwife

## 2019-10-24 ENCOUNTER — Other Ambulatory Visit: Payer: Self-pay

## 2019-10-24 ENCOUNTER — Encounter: Payer: Self-pay | Admitting: Certified Nurse Midwife

## 2019-10-24 VITALS — BP 105/74 | HR 93 | Wt 157.6 lb

## 2019-10-24 DIAGNOSIS — Z3483 Encounter for supervision of other normal pregnancy, third trimester: Secondary | ICD-10-CM

## 2019-10-24 DIAGNOSIS — Z3A32 32 weeks gestation of pregnancy: Secondary | ICD-10-CM

## 2019-10-24 DIAGNOSIS — E0590 Thyrotoxicosis, unspecified without thyrotoxic crisis or storm: Secondary | ICD-10-CM

## 2019-10-24 DIAGNOSIS — Z3A35 35 weeks gestation of pregnancy: Secondary | ICD-10-CM

## 2019-10-24 DIAGNOSIS — O99283 Endocrine, nutritional and metabolic diseases complicating pregnancy, third trimester: Secondary | ICD-10-CM | POA: Diagnosis not present

## 2019-10-24 LAB — POCT URINALYSIS DIPSTICK OB
Bilirubin, UA: NEGATIVE
Blood, UA: NEGATIVE
Glucose, UA: NEGATIVE
Ketones, UA: NEGATIVE
Leukocytes, UA: NEGATIVE
Nitrite, UA: NEGATIVE
POC,PROTEIN,UA: NEGATIVE
Spec Grav, UA: 1.01 (ref 1.010–1.025)
Urobilinogen, UA: 0.2 E.U./dL
pH, UA: 7.5 (ref 5.0–8.0)

## 2019-10-24 NOTE — Progress Notes (Signed)
ROB- Doing well. Growth scan completed today and reviewed with patient. All questions answered. Reports braxton hicks contractions. Attempted cervical exam at home. Discussed with patient risks of this to include infection, rupture of amniotic sac and if labor starts prior to 37 weeks unable to have a water birth. Has purchased her pool for water birth. Discussed setting up to make sure no issues prior to birth. Anticipatory guidance given. Reviewed red flags and when to call the office.   RTC x 1 week for ROB or sooner if needed.   Glorious Peach RN [facility] [employer] 10/24/19 2:04 PM

## 2019-10-24 NOTE — Progress Notes (Signed)
ULTRASOUND REPORT  Location: [facility] Date of Service: 10/24/2019   Indications:growth/afi Findings:  M[place] Jim intrauterine pregnancy is visualized with FHR at 140 BPM. Biometrics give an (U/S) Gestational age of [redacted]w[redacted]d and an (U/S) EDD of 12/06/2019; this correlates with the clinically established Estimated Date of Delivery: 11/27/19.  Fetal presentation is Cephalic.  Placenta: posterior. Grade: 2 AFI: 14.8 cm  Growth percentile is 26. EFW: 2286 g ( 5 lbs 1 oz)   Impression: 1. [redacted]w[redacted]d Viable [place] Intrauterine pregnancy previously established criteria. 2. Growth is 26 %ile.  AFI is 14.8 cm.   Recommendations: 1.Clinical correlation with the patient's History and Physical Exam.   I have seen, interviewed, and examined the patient in conjunction with the [employer] Nurse Practitioner student and affirm the diagnosis and management plan.   Gunnar Bulla, CNM [facility], [facility] 10/24/19 2:12 PM

## 2019-10-24 NOTE — Patient Instructions (Signed)
Fetal Movement Counts Patient Name: ________________________________________________ Patient Due Date: ____________________ What is a fetal movement count?  A fetal movement count is the number of times that you feel your baby move during a certain amount of time. This may also be called a fetal kick count. A fetal movement count is recommended for every pregnant woman. You may be asked to start counting fetal movements as early as week 28 of your pregnancy. Pay attention to when your baby is most active. You may notice your baby's sleep and wake cycles. You may also notice things that make your baby move more. You should do a fetal movement count:  When your baby is normally most active.  At the same time each day. A good time to count movements is while you are resting, after having something to eat and drink. How do I count fetal movements? 1. Find a quiet, comfortable area. Sit, or lie down on your side. 2. Write down the date, the start time and stop time, and the number of movements that you felt between those two times. Take this information with you to your health care visits. 3. Write down your start time when you feel the first movement. 4. Count kicks, flutters, swishes, rolls, and jabs. You should feel at least 10 movements. 5. You may stop counting after you have felt 10 movements, or if you have been counting for 2 hours. Write down the stop time. 6. If you do not feel 10 movements in 2 hours, contact your health care provider for further instructions. Your health care provider may want to do additional tests to assess your baby's well-being. Contact a health care provider if:  You feel fewer than 10 movements in 2 hours.  Your baby is not moving like he or she usually does. Date: ____________ Start time: ____________ Stop time: ____________ Movements: ____________ Date: ____________ Start time: ____________ Stop time: ____________ Movements: ____________ Date: ____________  Start time: ____________ Stop time: ____________ Movements: ____________ Date: ____________ Start time: ____________ Stop time: ____________ Movements: ____________ Date: ____________ Start time: ____________ Stop time: ____________ Movements: ____________ Date: ____________ Start time: ____________ Stop time: ____________ Movements: ____________ Date: ____________ Start time: ____________ Stop time: ____________ Movements: ____________ Date: ____________ Start time: ____________ Stop time: ____________ Movements: ____________ Date: ____________ Start time: ____________ Stop time: ____________ Movements: ____________ This information is not intended to replace advice given to you by your health care provider. Make sure you discuss any questions you have with your health care provider. Document Revised: 01/06/2019 Document Reviewed: 01/06/2019 Elsevier Patient Education  2020 Elsevier Inc.  

## 2019-10-24 NOTE — Progress Notes (Signed)
ROB-Pt present for routine prenatal care. Pt stated that she was doing well other than having lower pelvic pain.

## 2019-11-01 ENCOUNTER — Ambulatory Visit: Payer: Medicaid Other | Attending: Certified Nurse Midwife

## 2019-11-04 ENCOUNTER — Other Ambulatory Visit: Payer: Self-pay

## 2019-11-04 ENCOUNTER — Ambulatory Visit (INDEPENDENT_AMBULATORY_CARE_PROVIDER_SITE_OTHER): Payer: Medicaid Other | Admitting: Certified Nurse Midwife

## 2019-11-04 ENCOUNTER — Encounter: Payer: Self-pay | Admitting: Certified Nurse Midwife

## 2019-11-04 VITALS — BP 105/73 | HR 102 | Wt 160.8 lb

## 2019-11-04 DIAGNOSIS — Z3483 Encounter for supervision of other normal pregnancy, third trimester: Secondary | ICD-10-CM

## 2019-11-04 DIAGNOSIS — Z3A36 36 weeks gestation of pregnancy: Secondary | ICD-10-CM

## 2019-11-04 LAB — POCT URINALYSIS DIPSTICK OB
Bilirubin, UA: NEGATIVE
Blood, UA: NEGATIVE
Glucose, UA: NEGATIVE
Ketones, UA: NEGATIVE
Leukocytes, UA: NEGATIVE
Nitrite, UA: NEGATIVE
Spec Grav, UA: 1.02 (ref 1.010–1.025)
Urobilinogen, UA: 0.2 E.U./dL
pH, UA: 6.5 (ref 5.0–8.0)

## 2019-11-04 NOTE — Progress Notes (Signed)
I have seen, interviewed, and examined the patient in conjunction with the [employer] Nurse Practitioner student and affirm the diagnosis and management plan.   Gunnar Bulla, CNM [facility], [facility] 11/04/19 6:19 PM

## 2019-11-04 NOTE — Progress Notes (Signed)
ROB- Doing well, no concerns or questions today. Discussed herbal prep, handout provided. Completed water birth class and has supplies. Tested pool this past weekend. Will send certificate via my chart. Provided with labor checklist and discussed signs of labor. Thirty-six (36) week swabs collected today. SVE completed by Serafina Royals, CNM per patient request. Anticipatory guidance regarding course of prenatal care. Reviewed red flags and when to call the office.  RTC x 1 week for ROB or sooner if needed.  Glorious Peach RN [facility] [employer] 11/04/19 6:02 PM

## 2019-11-04 NOTE — Patient Instructions (Signed)
Fetal Movement Counts Patient Name: ________________________________________________ Patient Due Date: ____________________ What is a fetal movement count?  A fetal movement count is the number of times that you feel your baby move during a certain amount of time. This may also be called a fetal kick count. A fetal movement count is recommended for every pregnant woman. You may be asked to start counting fetal movements as early as week 28 of your pregnancy. Pay attention to when your baby is most active. You may notice your baby's sleep and wake cycles. You may also notice things that make your baby move more. You should do a fetal movement count:  When your baby is normally most active.  At the same time each day. A good time to count movements is while you are resting, after having something to eat and drink. How do I count fetal movements? 1. Find a quiet, comfortable area. Sit, or lie down on your side. 2. Write down the date, the start time and stop time, and the number of movements that you felt between those two times. Take this information with you to your health care visits. 3. Write down your start time when you feel the first movement. 4. Count kicks, flutters, swishes, rolls, and jabs. You should feel at least 10 movements. 5. You may stop counting after you have felt 10 movements, or if you have been counting for 2 hours. Write down the stop time. 6. If you do not feel 10 movements in 2 hours, contact your health care provider for further instructions. Your health care provider may want to do additional tests to assess your baby's well-being. Contact a health care provider if:  You feel fewer than 10 movements in 2 hours.  Your baby is not moving like he or she usually does. Date: ____________ Start time: ____________ Stop time: ____________ Movements: ____________ Date: ____________ Start time: ____________ Stop time: ____________ Movements: ____________ Date: ____________  Start time: ____________ Stop time: ____________ Movements: ____________ Date: ____________ Start time: ____________ Stop time: ____________ Movements: ____________ Date: ____________ Start time: ____________ Stop time: ____________ Movements: ____________ Date: ____________ Start time: ____________ Stop time: ____________ Movements: ____________ Date: ____________ Start time: ____________ Stop time: ____________ Movements: ____________ Date: ____________ Start time: ____________ Stop time: ____________ Movements: ____________ Date: ____________ Start time: ____________ Stop time: ____________ Movements: ____________ This information is not intended to replace advice given to you by your health care provider. Make sure you discuss any questions you have with your health care provider. Document Revised: 01/06/2019 Document Reviewed: 01/06/2019 Elsevier Patient Education  2020 Elsevier Inc.  

## 2019-11-05 LAB — MONITOR DRUG PROFILE 14(MW)
Amphetamine Scrn, Ur: NEGATIVE ng/mL
BARBITURATE SCREEN URINE: NEGATIVE ng/mL
BENZODIAZEPINE SCREEN, URINE: NEGATIVE ng/mL
Buprenorphine, Urine: NEGATIVE ng/mL
CANNABINOIDS UR QL SCN: NEGATIVE ng/mL
Cocaine (Metab) Scrn, Ur: NEGATIVE ng/mL
Creatinine(Crt), U: 155.7 mg/dL (ref 20.0–300.0)
Fentanyl, Urine: NEGATIVE pg/mL
Meperidine Screen, Urine: NEGATIVE ng/mL
Methadone Screen, Urine: NEGATIVE ng/mL
OXYCODONE+OXYMORPHONE UR QL SCN: NEGATIVE ng/mL
Opiate Scrn, Ur: NEGATIVE ng/mL
Ph of Urine: 6.3 (ref 4.5–8.9)
Phencyclidine Qn, Ur: NEGATIVE ng/mL
Propoxyphene Scrn, Ur: NEGATIVE ng/mL
SPECIFIC GRAVITY: 1.018
Tramadol Screen, Urine: NEGATIVE ng/mL

## 2019-11-06 ENCOUNTER — Encounter: Payer: Self-pay | Admitting: Certified Nurse Midwife

## 2019-11-06 DIAGNOSIS — B951 Streptococcus, group B, as the cause of diseases classified elsewhere: Secondary | ICD-10-CM | POA: Insufficient documentation

## 2019-11-06 LAB — STREP GP B NAA: Strep Gp B NAA: POSITIVE — AB

## 2019-11-07 LAB — GC/CHLAMYDIA PROBE AMP
Chlamydia trachomatis, NAA: NEGATIVE
Neisseria Gonorrhoeae by PCR: NEGATIVE

## 2019-11-10 ENCOUNTER — Ambulatory Visit (INDEPENDENT_AMBULATORY_CARE_PROVIDER_SITE_OTHER): Payer: Medicaid Other | Admitting: Certified Nurse Midwife

## 2019-11-10 ENCOUNTER — Encounter: Payer: Self-pay | Admitting: Certified Nurse Midwife

## 2019-11-10 ENCOUNTER — Other Ambulatory Visit: Payer: Self-pay

## 2019-11-10 VITALS — BP 112/73 | HR 95 | Wt 164.2 lb

## 2019-11-10 DIAGNOSIS — Z3493 Encounter for supervision of normal pregnancy, unspecified, third trimester: Secondary | ICD-10-CM

## 2019-11-10 DIAGNOSIS — Z3A37 37 weeks gestation of pregnancy: Secondary | ICD-10-CM

## 2019-11-10 NOTE — Patient Instructions (Signed)
Fetal Movement Counts Patient Name: ________________________________________________ Patient Due Date: ____________________ What is a fetal movement count?  A fetal movement count is the number of times that you feel your baby move during a certain amount of time. This may also be called a fetal kick count. A fetal movement count is recommended for every pregnant woman. You may be asked to start counting fetal movements as early as week 28 of your pregnancy. Pay attention to when your baby is most active. You may notice your baby's sleep and wake cycles. You may also notice things that make your baby move more. You should do a fetal movement count:  When your baby is normally most active.  At the same time each day. A good time to count movements is while you are resting, after having something to eat and drink. How do I count fetal movements? 1. Find a quiet, comfortable area. Sit, or lie down on your side. 2. Write down the date, the start time and stop time, and the number of movements that you felt between those two times. Take this information with you to your health care visits. 3. Write down your start time when you feel the first movement. 4. Count kicks, flutters, swishes, rolls, and jabs. You should feel at least 10 movements. 5. You may stop counting after you have felt 10 movements, or if you have been counting for 2 hours. Write down the stop time. 6. If you do not feel 10 movements in 2 hours, contact your health care provider for further instructions. Your health care provider may want to do additional tests to assess your baby's well-being. Contact a health care provider if:  You feel fewer than 10 movements in 2 hours.  Your baby is not moving like he or she usually does. Date: ____________ Start time: ____________ Stop time: ____________ Movements: ____________ Date: ____________ Start time: ____________ Stop time: ____________ Movements: ____________ Date: ____________  Start time: ____________ Stop time: ____________ Movements: ____________ Date: ____________ Start time: ____________ Stop time: ____________ Movements: ____________ Date: ____________ Start time: ____________ Stop time: ____________ Movements: ____________ Date: ____________ Start time: ____________ Stop time: ____________ Movements: ____________ Date: ____________ Start time: ____________ Stop time: ____________ Movements: ____________ Date: ____________ Start time: ____________ Stop time: ____________ Movements: ____________ Date: ____________ Start time: ____________ Stop time: ____________ Movements: ____________ This information is not intended to replace advice given to you by your health care provider. Make sure you discuss any questions you have with your health care provider. Document Revised: 01/06/2019 Document Reviewed: 01/06/2019 Elsevier Patient Education  2020 Elsevier Inc.  

## 2019-11-10 NOTE — Progress Notes (Signed)
ROB-Pt present for routine prenatal care. Pt stated she was doing well no problems.  °

## 2019-11-10 NOTE — Progress Notes (Signed)
ROB-Doing well, ready for baby to come. Taking castor oil after researching on [employer]. Advised against the use of castor oil to induce labor at this point in pregnancy. "Opt out" form completed for drug screening. Informed patient CNM will be out of town this week; however, Dr Valentino Saxon. will be available if she goes into labor, verbalized understanding. Anticipatory guidance regarding course of prenatal care. Reviewed red flag symptoms and when to call. RTC x 1 week for ROB or sooner if needed.

## 2019-11-18 ENCOUNTER — Encounter: Payer: Self-pay | Admitting: Certified Nurse Midwife

## 2019-11-18 ENCOUNTER — Ambulatory Visit (INDEPENDENT_AMBULATORY_CARE_PROVIDER_SITE_OTHER): Payer: Medicaid Other | Admitting: Certified Nurse Midwife

## 2019-11-18 ENCOUNTER — Other Ambulatory Visit: Payer: Self-pay

## 2019-11-18 VITALS — BP 111/78 | HR 97 | Wt 166.9 lb

## 2019-11-18 DIAGNOSIS — Z3A38 38 weeks gestation of pregnancy: Secondary | ICD-10-CM

## 2019-11-18 DIAGNOSIS — Z3493 Encounter for supervision of normal pregnancy, unspecified, third trimester: Secondary | ICD-10-CM

## 2019-11-18 LAB — POCT URINALYSIS DIPSTICK OB
Bilirubin, UA: NEGATIVE
Blood, UA: NEGATIVE
Glucose, UA: NEGATIVE
Ketones, UA: NEGATIVE
Leukocytes, UA: NEGATIVE
Nitrite, UA: NEGATIVE
Spec Grav, UA: 1.01 (ref 1.010–1.025)
Urobilinogen, UA: 0.2 E.U./dL
pH, UA: 7.5 (ref 5.0–8.0)

## 2019-11-18 NOTE — Patient Instructions (Signed)
Fetal Movement Counts Patient Name: ________________________________________________ Patient Due Date: ____________________ What is a fetal movement count?  A fetal movement count is the number of times that you feel your baby move during a certain amount of time. This may also be called a fetal kick count. A fetal movement count is recommended for every pregnant woman. You may be asked to start counting fetal movements as early as week 28 of your pregnancy. Pay attention to when your baby is most active. You may notice your baby's sleep and wake cycles. You may also notice things that make your baby move more. You should do a fetal movement count:  When your baby is normally most active.  At the same time each day. A good time to count movements is while you are resting, after having something to eat and drink. How do I count fetal movements? 1. Find a quiet, comfortable area. Sit, or lie down on your side. 2. Write down the date, the start time and stop time, and the number of movements that you felt between those two times. Take this information with you to your health care visits. 3. Write down your start time when you feel the first movement. 4. Count kicks, flutters, swishes, rolls, and jabs. You should feel at least 10 movements. 5. You may stop counting after you have felt 10 movements, or if you have been counting for 2 hours. Write down the stop time. 6. If you do not feel 10 movements in 2 hours, contact your health care provider for further instructions. Your health care provider may want to do additional tests to assess your baby's well-being. Contact a health care provider if:  You feel fewer than 10 movements in 2 hours.  Your baby is not moving like he or she usually does. Date: ____________ Start time: ____________ Stop time: ____________ Movements: ____________ Date: ____________ Start time: ____________ Stop time: ____________ Movements: ____________ Date: ____________  Start time: ____________ Stop time: ____________ Movements: ____________ Date: ____________ Start time: ____________ Stop time: ____________ Movements: ____________ Date: ____________ Start time: ____________ Stop time: ____________ Movements: ____________ Date: ____________ Start time: ____________ Stop time: ____________ Movements: ____________ Date: ____________ Start time: ____________ Stop time: ____________ Movements: ____________ Date: ____________ Start time: ____________ Stop time: ____________ Movements: ____________ Date: ____________ Start time: ____________ Stop time: ____________ Movements: ____________ This information is not intended to replace advice given to you by your health care provider. Make sure you discuss any questions you have with your health care provider. Document Revised: 01/06/2019 Document Reviewed: 01/06/2019 Elsevier Patient Education  2020 Elsevier Inc.  

## 2019-11-18 NOTE — Progress Notes (Signed)
ROB-Reports bilateral hip pain and increased pelvic pressure; "ready to have baby". Drinking labor tea and taking EPO daily. Discussed home labor preparation techniques. Request SVE and membrane sweep. Anticipatory guidance regarding course of prenatal care. Reviewed red flag symptoms and when to call. RTC x 1 week for ROB or sooner if needed.

## 2019-11-21 ENCOUNTER — Other Ambulatory Visit: Payer: Self-pay

## 2019-11-21 ENCOUNTER — Encounter: Payer: Self-pay | Admitting: Certified Nurse Midwife

## 2019-11-21 ENCOUNTER — Telehealth: Payer: Self-pay | Admitting: Certified Nurse Midwife

## 2019-11-21 ENCOUNTER — Ambulatory Visit (INDEPENDENT_AMBULATORY_CARE_PROVIDER_SITE_OTHER): Payer: Medicaid Other | Admitting: Certified Nurse Midwife

## 2019-11-21 VITALS — BP 117/83 | HR 114 | Wt 167.8 lb

## 2019-11-21 DIAGNOSIS — O479 False labor, unspecified: Secondary | ICD-10-CM | POA: Diagnosis not present

## 2019-11-21 DIAGNOSIS — Z3493 Encounter for supervision of normal pregnancy, unspecified, third trimester: Secondary | ICD-10-CM

## 2019-11-21 DIAGNOSIS — Z3A39 39 weeks gestation of pregnancy: Secondary | ICD-10-CM | POA: Diagnosis not present

## 2019-11-21 NOTE — Progress Notes (Signed)
I have seen, interviewed, and examined the patient in conjunction with the [employer] Nurse Practitioner student and affirm the diagnosis and management plan.   Gunnar Bulla, CNM [facility], [facility] 11/21/19 1:29 PM

## 2019-11-21 NOTE — Progress Notes (Signed)
Subjective:   Cindy Brock is a 24 y.o. [id number] [redacted]w[redacted]d being seen today for work in problem visit.  Patient reports contractions since 6 AM and leaking of fluid.   Contractions were every 3-5 minutes lasting about 50 seconds. Also reports woke up with half dollar amount of water on the bed. Denies recent intercourse. Contractions have slowed down to about every 20 minutes.  Denies vaginal bleeding.  Reports good fetal movement.  Denies difficulty breathing or respiratory distress, chest pain, abdominal pain, excessive vaginal bleeding, dysuria, leg pain or swelling  The following portions of the patient's history were reviewed and updated as appropriate: allergies, current medications, past family history, past medical history, past social history, past surgical history and problem list.   Review of Systems:  ROS: Negative except as noted above. Information obtained from patient.   Objective:  BP 117/83   Pulse (!) 114   Wt 167 lb 12.8 oz (76.1 kg)   LMP 02/20/2019 (Approximate)   BMI 27.08 kg/m   FHT: Fetal Heart Rate (bpm): 144  Fetal Movement: Movement: Present  Presentation: Presentation: Vertex    Abdomen:  soft, gravid, appropriate for gestational age,non-tender  Vaginal:  Discharge, white; no pooling present  Cervix: 1.5,40%,-2   No results found for this or any previous visit (from the past 24 hour(s)).  Assessment    Pregnancy:  G4P1021 at [redacted]w[redacted]d  1. Prenatal care in third trimester  - POC Urinalysis Dipstick OB  2. [redacted] weeks gestation of pregnancy  - POC Urinalysis Dipstick OB  3. Irregular Contractions  4. False Labor   Plan:   Nitrazine paper: negative for amniotic fluid   Slide evaluated under microscope: No ferning present  Preterm labor symptoms: vaginal bleeding, contractions and leaking of fluid reviewed in detail.  Fetal movement precautions reviewed.  Encouraged patient to do [employer], squats and curb walking.   Reviewed red flag  symptoms and when to call the office.  Follow up: as previously scheduled or sooner if needed.   Glorious Peach RN [facility] [employer] 11/21/19 12:24 PM

## 2019-11-21 NOTE — Patient Instructions (Signed)
First Stage of Labor Labor is your body's natural process of moving your baby and other structures, including the placenta and umbilical cord, out of your uterus. There are three stages of labor. How long each stage lasts is different for every woman. But certain events happen during each stage that are the same for everyone.  The first stage starts when true labor begins. This stage ends when your cervix, which is the opening from your uterus into your vagina, is completely open (dilated).  The second stage begins when your cervix is fully dilated and you start pushing. This stage ends when your baby is born.  The third stage is the delivery of the organ that nourished your baby during pregnancy (placenta). First stage of labor As your due date gets closer, you may start to notice certain physical changes that mean labor is going to start soon. You may feel that your baby has dropped lower into your pelvis. You may experience irregular, often painless, contractions that go away when you walk around or lie down (Braxton Hicks contractions). This is also called false labor. The first stage of labor begins when you start having contractions that come at regular (evenly spaced) intervals and your cervix starts to get thinner and wider in preparation for your baby to pass through. Birth care providers measure the dilation of your cervix in centimeters (cm). One centimeter is a little less than one-half of an inch. The first stage ends when your cervix is dilated to 10 cm. The first stage of labor is divided into three phases:  Early phase.  Active phase.  Transitional phase. The length of the first stage of labor varies. It may be longer if this is your first pregnancy. You may spend most of this stage at home trying to relax and stay comfortable. How does this affect me? During the first stage of labor, you will move through three phases. What happens in the early phase?  You will start to have  regular contractions that last 30-60 seconds. Contractions may come every 5-20 minutes. Keep track of your contractions and call your birth care provider.  Your water may break during this phase.  You may notice a clear or slightly bloody discharge of mucus (mucus plug) from your vagina.  Your cervix will dilate to 3-6 cm. What happens in the active phase? The active phase usually lasts 3-5 hours. You may go to the hospital or birth center around this time. During the active phase:  Your contractions will become stronger, longer, and more uncomfortable.  Your contractions may last 45-90 seconds and come every 3-5 minutes.  You may feel lower back pain.  Your birth care providers may examine your cervix and feel your belly to find the position of your baby.  You may have a monitor strapped to your belly to measure your contractions and your baby's heart rate.  You may start using your pain management options.  Your cervix may be dilated to 6 cm and may start to dilate more quickly. What happens in the transitional phase? The transitional phase typically lasts from 30 minutes to 2 hours. At the end of this phase, your cervix will be fully dilated to 10 cm. During the transitional phase:  Contractions will get stronger and longer.  Contractions may last 60-90 seconds and come less than 2 minutes apart.  You may feel hot flashes, chills, or nausea. How does this affect my baby? During the first stage of labor, your baby will   gradually move down into your birth canal. Follow these instructions at home and in the hospital or birth center:   When labor first begins, try to stay calm. You are still in the early phase. If it is night, try to get some sleep. If it is day, try to relax and save your energy. You may want to make some calls and get ready to go to the hospital or birth center.  When you are in the early phase, try these methods to help ease discomfort: ? Deep breathing and  muscle relaxation. ? Taking a walk. ? Taking a warm bath or shower.  Drink some fluids and have a light snack if you feel like it.  Keep track of your contractions.  Based on the plan you created with your birth care provider, call when your contractions indicate it is time.  If your water breaks, note the time, color, and odor of the fluid.  When you are in the active phase, do your breathing exercises and rely on your support people and your team of birth care providers. Contact a health care provider if:  Your contractions are strong and regular.  You have lower back pain or cramping.  Your water breaks.  You lose your mucus plug. Get help right away if you:  Have a severe headache that does not go away.  Have changes in your vision.  Have severe pain in your upper belly.  Do not feel the baby move.  Have bright red bleeding. Summary  The first stage of labor starts when true labor begins, and it ends when your cervix is dilated to 10 cm.  The first stage of labor has three phases: early, active, and transitional.  Your baby moves into the birth canal during the first stage of labor.  You may have contractions that become stronger and longer. You may also lose your mucus plug and have your water break.  Call your birth care provider when your contractions are frequent and strong enough to go to the hospital or birth center. This information is not intended to replace advice given to you by your health care provider. Make sure you discuss any questions you have with your health care provider. Document Revised: 09/09/2018 Document Reviewed: 08/02/2017 Elsevier Patient Education  [place].   Signs and Symptoms of Labor Labor is your body's natural process of moving your baby, placenta, and umbilical cord out of your uterus. The process of labor usually starts when your baby is full-term, between 75 and 40 weeks of pregnancy. How will I know when I am close  to going into labor? As your body prepares for labor and the birth of your baby, you may notice the following symptoms in the weeks and days before true labor starts:  Having a strong desire to get your home ready to receive your new baby. This is called nesting. Nesting may be a sign that labor is approaching, and it may occur several weeks before birth. Nesting may involve cleaning and organizing your home.  Passing a small amount of thick, bloody mucus out of your vagina (normal bloody show or losing your mucus plug). This may happen more than a week before labor begins, or it might occur right before labor begins as the opening of the cervix starts to widen (dilate). For some women, the entire mucus plug passes at once. For others, smaller portions of the mucus plug may gradually pass over several days.  Your baby moving (dropping)  lower in your pelvis to get into position for birth (lightening). When this happens, you may feel more pressure on your bladder and pelvic bone and less pressure on your ribs. This may make it easier to breathe. It may also cause you to need to urinate more often and have problems with bowel movements.  Having "practice contractions" (Braxton Hicks contractions) that occur at irregular (unevenly spaced) intervals that are more than 10 minutes apart. This is also called false labor. False labor contractions are common after exercise or sexual activity, and they will stop if you change position, rest, or drink fluids. These contractions are usually mild and do not get stronger over time. They may feel like: ? A backache or back pain. ? Mild cramps, similar to menstrual cramps. ? Tightening or pressure in your abdomen. Other early symptoms that labor may be starting soon include:  Nausea or loss of appetite.  Diarrhea.  Having a sudden burst of energy, or feeling very tired.  Mood changes.  Having trouble sleeping. How will I know when labor has begun? Signs that  true labor has begun may include:  Having contractions that come at regular (evenly spaced) intervals and increase in intensity. This may feel like more intense tightening or pressure in your abdomen that moves to your back. ? Contractions may also feel like rhythmic pain in your upper thighs or back that comes and goes at regular intervals. ? For first-time mothers, this change in intensity of contractions often occurs at a more gradual pace. ? Women who have given birth before may notice a more rapid progression of contraction changes.  Having a feeling of pressure in the vaginal area.  Your water breaking (rupture of membranes). This is when the sac of fluid that surrounds your baby breaks. When this happens, you will notice fluid leaking from your vagina. This may be clear or blood-tinged. Labor usually starts within 24 hours of your water breaking, but it may take longer to begin. ? Some women notice this as a gush of fluid. ? Others notice that their underwear repeatedly becomes damp. Follow these instructions at home:   When labor starts, or if your water breaks, call your health care provider or nurse care line. Based on your situation, they will determine when you should go in for an exam.  When you are in early labor, you may be able to rest and manage symptoms at home. Some strategies to try at home include: ? Breathing and relaxation techniques. ? Taking a warm bath or shower. ? Listening to music. ? Using a heating pad on the lower back for pain. If you are directed to use heat:  Place a towel between your skin and the heat source.  Leave the heat on for 20-30 minutes.  Remove the heat if your skin turns bright red. This is especially important if you are unable to feel pain, heat, or cold. You may have a greater risk of getting burned. Get help right away if:  You have painful, regular contractions that are 5 minutes apart or less.  Labor starts before you are [redacted] weeks  along in your pregnancy.  You have a fever.  You have a headache that does not go away.  You have bright red blood coming from your vagina.  You do not feel your baby moving.  You have a sudden onset of: ? Severe headache with vision problems. ? Nausea, vomiting, or diarrhea. ? Chest pain or shortness of breath. These  symptoms may be an emergency. If your health care provider recommends that you go to the hospital or birth center where you plan to deliver, do not drive yourself. Have someone else drive you, or call emergency services (911 in the [place] Summary  Labor is your body's natural process of moving your baby, placenta, and umbilical cord out of your uterus.  The process of labor usually starts when your baby is full-term, between 23 and 40 weeks of pregnancy.  When labor starts, or if your water breaks, call your health care provider or nurse care line. Based on your situation, they will determine when you should go in for an exam. This information is not intended to replace advice given to you by your health care provider. Make sure you discuss any questions you have with your health care provider. Document Revised: 02/16/2017 Document Reviewed: 10/24/2016 Elsevier Patient Education  2020 [employer].

## 2019-11-21 NOTE — Telephone Encounter (Addendum)
Pt called in and stated that she thinks she is having contractions. Called back and the nurse said to add her on for today

## 2019-11-22 ENCOUNTER — Telehealth: Payer: Self-pay | Admitting: Certified Nurse Midwife

## 2019-11-22 NOTE — Telephone Encounter (Signed)
Spoke to pt concerning her brown discharge. Pt stated that she has been noticing this morning. Pt stated she was still having contractions. Pt was informed that the brown bloody discharge was normal along with the contractions. Pt was advised that if the contractions became closer together like 5-10 minutes or her water breaks to please go to the ED. Pt voiced that she understood.

## 2019-11-22 NOTE — Telephone Encounter (Signed)
Pt called in and stated that she is still having on and off contractions. The pt stated that she has a brownish red discharge. The baby is moving the pt stated. The pt wanting to know if she can be seen. I told her that her provider is out of the office. I reached out to CM and she stated to take a detail message and call michelle. I called michelle and told her what the pt stated she asked me if Dr.Cherry was there, and to send her a message.

## 2019-11-22 NOTE — Telephone Encounter (Signed)
Patient called in saying she was having brown mucus coming out of her vagina. Patient would like to know if this is normal or not. Could you please advise?

## 2019-11-23 ENCOUNTER — Inpatient Hospital Stay
Admission: AD | Admit: 2019-11-23 | Discharge: 2019-11-24 | DRG: 806 | Disposition: A | Discharge status: Home / Self Care | Payer: Medicaid Other | Attending: Certified Nurse Midwife | Admitting: Certified Nurse Midwife

## 2019-11-23 ENCOUNTER — Other Ambulatory Visit: Payer: Self-pay

## 2019-11-23 ENCOUNTER — Encounter: Payer: Self-pay | Admitting: Obstetrics and Gynecology

## 2019-11-23 DIAGNOSIS — O0930 Supervision of pregnancy with insufficient antenatal care, unspecified trimester: Secondary | ICD-10-CM | POA: Diagnosis not present

## 2019-11-23 DIAGNOSIS — Z283 Underimmunization status: Secondary | ICD-10-CM | POA: Diagnosis not present

## 2019-11-23 DIAGNOSIS — D62 Acute posthemorrhagic anemia: Secondary | ICD-10-CM | POA: Diagnosis not present

## 2019-11-23 DIAGNOSIS — J45909 Unspecified asthma, uncomplicated: Secondary | ICD-10-CM | POA: Diagnosis present

## 2019-11-23 DIAGNOSIS — Z20822 Contact with and (suspected) exposure to covid-19: Secondary | ICD-10-CM | POA: Diagnosis present

## 2019-11-23 DIAGNOSIS — O9952 Diseases of the respiratory system complicating childbirth: Secondary | ICD-10-CM | POA: Diagnosis present

## 2019-11-23 DIAGNOSIS — Z3A39 39 weeks gestation of pregnancy: Secondary | ICD-10-CM | POA: Diagnosis not present

## 2019-11-23 DIAGNOSIS — B951 Streptococcus, group B, as the cause of diseases classified elsewhere: Secondary | ICD-10-CM | POA: Diagnosis not present

## 2019-11-23 DIAGNOSIS — O99824 Streptococcus B carrier state complicating childbirth: Secondary | ICD-10-CM | POA: Diagnosis present

## 2019-11-23 DIAGNOSIS — Z2839 Other underimmunization status: Secondary | ICD-10-CM

## 2019-11-23 DIAGNOSIS — O99891 Other specified diseases and conditions complicating pregnancy: Secondary | ICD-10-CM

## 2019-11-23 DIAGNOSIS — O9081 Anemia of the puerperium: Secondary | ICD-10-CM | POA: Diagnosis not present

## 2019-11-23 DIAGNOSIS — O26893 Other specified pregnancy related conditions, third trimester: Secondary | ICD-10-CM | POA: Diagnosis present

## 2019-11-23 DIAGNOSIS — Z2821 Immunization not carried out because of patient refusal: Secondary | ICD-10-CM

## 2019-11-23 DIAGNOSIS — O09899 Supervision of other high risk pregnancies, unspecified trimester: Secondary | ICD-10-CM | POA: Diagnosis not present

## 2019-11-23 LAB — TYPE AND SCREEN
ABO/RH(D): O POS
Antibody Screen: NEGATIVE

## 2019-11-23 LAB — CBC WITH DIFFERENTIAL/PLATELET
Abs Immature Granulocytes: 0.05 10*3/uL (ref 0.00–0.07)
Basophils Absolute: 0.1 10*3/uL (ref 0.0–0.1)
Basophils Relative: 0 %
Eosinophils Absolute: 0.1 10*3/uL (ref 0.0–0.5)
Eosinophils Relative: 1 %
HCT: 35.5 % — ABNORMAL LOW (ref 36.0–46.0)
Hemoglobin: 11.7 g/dL — ABNORMAL LOW (ref 12.0–15.0)
Immature Granulocytes: 0 %
Lymphocytes Relative: 14 %
Lymphs Abs: 1.6 10*3/uL (ref 0.7–4.0)
MCH: 25.4 pg — ABNORMAL LOW (ref 26.0–34.0)
MCHC: 33 g/dL (ref 30.0–36.0)
MCV: 77.2 fL — ABNORMAL LOW (ref 80.0–100.0)
Monocytes Absolute: 0.9 10*3/uL (ref 0.1–1.0)
Monocytes Relative: 8 %
Neutro Abs: 8.7 10*3/uL — ABNORMAL HIGH (ref 1.7–7.7)
Neutrophils Relative %: 77 %
Platelets: 285 10*3/uL (ref 150–400)
RBC: 4.6 MIL/uL (ref 3.87–5.11)
RDW: 14.7 % (ref 11.5–15.5)
WBC: 11.3 10*3/uL — ABNORMAL HIGH (ref 4.0–10.5)
nRBC: 0 % (ref 0.0–0.2)

## 2019-11-23 LAB — ABO/RH: ABO/RH(D): O POS

## 2019-11-23 LAB — SARS CORONAVIRUS 2 BY RT PCR (HOSPITAL ORDER, PERFORMED IN ~~LOC~~ HOSPITAL LAB): SARS Coronavirus 2: NEGATIVE

## 2019-11-23 MED ORDER — IBUPROFEN 600 MG PO TABS
600.0000 mg | ORAL_TABLET | Freq: Four times a day (QID) | ORAL | Status: DC
  Administered 2019-11-23 – 2019-11-24 (×4): 600 mg via ORAL
  Filled 2019-11-23 (×4): qty 1

## 2019-11-23 MED ORDER — SODIUM CHLORIDE 0.9 % IV SOLN: 2.0000 g | Freq: Once | INTRAVENOUS | Status: AC

## 2019-11-23 MED ORDER — PROMETHAZINE HCL 25 MG/ML IJ SOLN
25.0000 mg | Freq: Once | INTRAMUSCULAR | Status: AC
  Administered 2019-11-23: 25 mg via INTRAMUSCULAR
  Filled 2019-11-23: qty 1

## 2019-11-23 MED ORDER — OXYTOCIN 10 UNIT/ML IJ SOLN
INTRAMUSCULAR | Status: AC
  Administered 2019-11-23: 10 [IU] via INTRAMUSCULAR
  Filled 2019-11-23: qty 2

## 2019-11-23 MED ORDER — SODIUM CHLORIDE 0.9 % IV SOLN
INTRAVENOUS | Status: AC
  Administered 2019-11-23: 2 g via INTRAVENOUS
  Filled 2019-11-23: qty 2000

## 2019-11-23 MED ORDER — SODIUM CHLORIDE 0.9% FLUSH: 3.0000 mL | Freq: Two times a day (BID) | INTRAVENOUS | Status: DC

## 2019-11-23 MED ORDER — SODIUM CHLORIDE 0.9% FLUSH
3.0000 mL | INTRAVENOUS | Status: DC | PRN
  Administered 2019-11-23: 3 mL via INTRAVENOUS

## 2019-11-23 MED ORDER — ONDANSETRON HCL 4 MG/2ML IJ SOLN: 4.0000 mg | INTRAMUSCULAR | Status: DC | PRN

## 2019-11-23 MED ORDER — DIPHENHYDRAMINE HCL 25 MG PO CAPS: 25.0000 mg | ORAL_CAPSULE | Freq: Four times a day (QID) | ORAL | Status: DC | PRN

## 2019-11-23 MED ORDER — AMMONIA AROMATIC IN INHA
RESPIRATORY_TRACT | Status: AC
  Filled 2019-11-23: qty 10

## 2019-11-23 MED ORDER — OXYCODONE-ACETAMINOPHEN 5-325 MG PO TABS: 2.0000 | ORAL_TABLET | ORAL | Status: DC | PRN

## 2019-11-23 MED ORDER — SODIUM CHLORIDE 0.9 % IV SOLN: 250.0000 mL | INTRAVENOUS | Status: DC | PRN

## 2019-11-23 MED ORDER — LACTATED RINGERS IV SOLN: INTRAVENOUS | Status: DC

## 2019-11-23 MED ORDER — MISOPROSTOL 200 MCG PO TABS
ORAL_TABLET | ORAL | Status: AC
  Filled 2019-11-23: qty 4

## 2019-11-23 MED ORDER — MORPHINE SULFATE (PF) 4 MG/ML IV SOLN
8.0000 mg | Freq: Once | INTRAVENOUS | Status: AC
  Administered 2019-11-23: 8 mg via INTRAMUSCULAR
  Filled 2019-11-23: qty 2

## 2019-11-23 MED ORDER — BENZOCAINE-MENTHOL 20-0.5 % EX AERO: 1.0000 "application " | INHALATION_SPRAY | CUTANEOUS | Status: DC | PRN

## 2019-11-23 MED ORDER — OXYCODONE HCL 5 MG PO TABS: 10.0000 mg | ORAL_TABLET | ORAL | Status: DC | PRN

## 2019-11-23 MED ORDER — PRENATAL MULTIVITAMIN CH
1.0000 | ORAL_TABLET | Freq: Every day | ORAL | Status: DC
  Administered 2019-11-24: 1 via ORAL
  Filled 2019-11-23: qty 1

## 2019-11-23 MED ORDER — OXYCODONE HCL 5 MG PO TABS: 5.0000 mg | ORAL_TABLET | ORAL | Status: DC | PRN

## 2019-11-23 MED ORDER — ACETAMINOPHEN 325 MG PO TABS
650.0000 mg | ORAL_TABLET | ORAL | Status: DC | PRN
  Administered 2019-11-23 – 2019-11-24 (×3): 650 mg via ORAL
  Filled 2019-11-23 (×3): qty 2

## 2019-11-23 MED ORDER — SODIUM CHLORIDE 0.9% FLUSH: 3.0000 mL | INTRAVENOUS | Status: DC | PRN

## 2019-11-23 MED ORDER — OXYTOCIN-SODIUM CHLORIDE 30-0.9 UT/500ML-% IV SOLN: 2.5000 [IU]/h | INTRAVENOUS | Status: DC

## 2019-11-23 MED ORDER — SENNOSIDES-DOCUSATE SODIUM 8.6-50 MG PO TABS: 2.0000 | ORAL_TABLET | ORAL | Status: DC

## 2019-11-23 MED ORDER — WITCH HAZEL-GLYCERIN EX PADS: 1.0000 "application " | MEDICATED_PAD | CUTANEOUS | Status: DC | PRN

## 2019-11-23 MED ORDER — LACTATED RINGERS IV SOLN: 500.0000 mL | INTRAVENOUS | Status: DC | PRN

## 2019-11-23 MED ORDER — SIMETHICONE 80 MG PO CHEW: 80.0000 mg | CHEWABLE_TABLET | ORAL | Status: DC | PRN

## 2019-11-23 MED ORDER — SODIUM CHLORIDE 0.9 % IV SOLN
1.0000 g | INTRAVENOUS | Status: DC
  Administered 2019-11-23: 1 g via INTRAVENOUS
  Filled 2019-11-23: qty 1000

## 2019-11-23 MED ORDER — ONDANSETRON 4 MG PO TBDP
ORAL_TABLET | ORAL | Status: AC
  Filled 2019-11-23: qty 1

## 2019-11-23 MED ORDER — DIBUCAINE (PERIANAL) 1 % EX OINT: 1.0000 "application " | TOPICAL_OINTMENT | CUTANEOUS | Status: DC | PRN

## 2019-11-23 MED ORDER — ALBUTEROL SULFATE (2.5 MG/3ML) 0.083% IN NEBU: 2.5000 mg | INHALATION_SOLUTION | Freq: Four times a day (QID) | RESPIRATORY_TRACT | Status: DC | PRN

## 2019-11-23 MED ORDER — BENZOCAINE-MENTHOL 20-0.5 % EX AERO
INHALATION_SPRAY | CUTANEOUS | Status: AC
  Filled 2019-11-23: qty 56

## 2019-11-23 MED ORDER — SOD CITRATE-CITRIC ACID 500-334 MG/5ML PO SOLN: 30.0000 mL | ORAL | Status: DC | PRN

## 2019-11-23 MED ORDER — ONDANSETRON HCL 4 MG/2ML IJ SOLN: 4.0000 mg | Freq: Four times a day (QID) | INTRAMUSCULAR | Status: DC | PRN

## 2019-11-23 MED ORDER — OXYTOCIN-SODIUM CHLORIDE 30-0.9 UT/500ML-% IV SOLN
INTRAVENOUS | Status: AC
  Filled 2019-11-23: qty 1000

## 2019-11-23 MED ORDER — LIDOCAINE HCL (PF) 1 % IJ SOLN: 30.0000 mL | INTRAMUSCULAR | Status: DC | PRN

## 2019-11-23 MED ORDER — ACETAMINOPHEN 325 MG PO TABS: 650.0000 mg | ORAL_TABLET | ORAL | Status: DC | PRN

## 2019-11-23 MED ORDER — ONDANSETRON 4 MG PO TBDP
4.0000 mg | ORAL_TABLET | Freq: Once | ORAL | Status: AC
  Administered 2019-11-23: 4 mg via ORAL

## 2019-11-23 MED ORDER — OXYTOCIN BOLUS FROM INFUSION: 333.0000 mL | Freq: Once | INTRAVENOUS | Status: DC

## 2019-11-23 MED ORDER — OXYCODONE-ACETAMINOPHEN 5-325 MG PO TABS: 1.0000 | ORAL_TABLET | ORAL | Status: DC | PRN

## 2019-11-23 MED ORDER — ONDANSETRON HCL 4 MG PO TABS: 4.0000 mg | ORAL_TABLET | ORAL | Status: DC | PRN

## 2019-11-23 MED ORDER — COCONUT OIL OIL: 1.0000 "application " | TOPICAL_OIL | Status: DC | PRN

## 2019-11-23 MED ORDER — LIDOCAINE HCL (PF) 1 % IJ SOLN
INTRAMUSCULAR | Status: AC
  Filled 2019-11-23: qty 30

## 2019-11-23 NOTE — OB Triage Note (Signed)
Pt is a 23yo G4P1 at [redacted]w[redacted]d that presents from ED with c/o ctx. Pt states ctx started 3 days ago but became consistent around 0300 this morning. Pt states ctx are coming every 2-3 minutes and rates pain 8/10. Pt denies VB, LOF and states positive FM. EFM applied and intial FHT 140.

## 2019-11-23 NOTE — H&P (Signed)
Obstetric History and Physical  Cindy Brock is a 24 y.o. [id number] with IUP at [redacted]w[redacted]d presenting with irregular painful contractions for the last three (3) days.   Patient states she has been having  irregular, every two (2) to four (4) minutes contractions, none vaginal bleeding, intact membranes, with active fetal movement.    Denies difficulty breathing or respiratory distress, chest pain, dysuria, and leg pain or swelling.   Prenatal Course  Source of Care: [facility]-initial visit at 8 weeks, total visits: 11   Pregnancy complications or risks: Limited prenatal care, Hyperthyroidism, Rubella non-immune, Varicella non-immune, Group beta strep positive, History of marijuana user   Prenatal labs and studies:  ABO, Rh: --/--/PENDING 12-09-22 1223)  Antibody: PENDING 12-09-2022 1223)  Rubella: <0.90 (11/19 1419)  Varicella: Non immune (11/19 1419)  RPR: Non Reactive (05/03 1210)   HBsAg: Negative (11/19 1419)   HIV: Non Reactive (11/19 1419)   AVW:UJWJXBJY/-- (06/04 1648)  1 hr Glucola: Not completed, HA1c: 5.5 (05/03 1210)  Genetic screening: Low risk female (01/12 1513)  Anatomy US: Complete, normal (02/11 7829)  Past Medical History:  Diagnosis Date  . Asthma   . Hyperthyroidism   . Thyroid disease     Past Surgical History:  Procedure Laterality Date  . NO PAST SURGERIES      OB History  Gravida Para Term Preterm AB Living  4 1 1   2 1   SAB TAB Ectopic Multiple Live Births  2       1    # Outcome Date GA Lbr Len/2nd Weight Sex Delivery Anes PTL Lv  4 Current           3 SAB 2015          2 Term 02/24/13   2268 g  Vag-Spont  N LIV  1 SAB 2014            Social History   Socioeconomic History  . Marital status: Significant Other    Spouse name: Chevaughn  . Number of children: Not on file  . Years of education: Not on file  . Highest education level: Not on file  Occupational History  . Not on file  Tobacco Use  . Smoking status: Never Smoker  .  Smokeless tobacco: Never Used  Vaping Use  . Vaping Use: Never used  Substance and Sexual Activity  . Alcohol use: Not Currently    Comment: social   . Drug use: Never  . Sexual activity: Not Currently    Birth control/protection: None  Other Topics Concern  . Not on file  Social History Narrative  . Not on file   Social Determinants of Health   Financial Resource Strain:   . Difficulty of Paying Living Expenses:   Food Insecurity:   . Worried About Charity fundraiser in the Last Year:   . Arboriculturist in the Last Year:   Transportation Needs:   . Film/video editor (Medical):   Marland Kitchen Lack of Transportation (Non-Medical):   Physical Activity:   . Days of Exercise per Week:   . Minutes of Exercise per Session:   Stress:   . Feeling of Stress :   Social Connections:   . Frequency of Communication with Friends and Family:   . Frequency of Social Gatherings with Friends and Family:   . Attends Religious Services:   . Active Member of Clubs or Organizations:   . Attends Archivist Meetings:   Marland Kitchen Marital  Status:     Family History  Problem Relation Age of Onset  . Rheum arthritis Mother   . Hypothyroidism Maternal Grandmother     Medications Prior to Admission  Medication Sig Dispense Refill Last Dose  . Prenatal MV-Min-Fe Fum-FA-DHA (PRENATAL 1 PO) Take by mouth.   11/22/2019 at Unknown time  . albuterol (VENTOLIN HFA) 108 (90 Base) MCG/ACT inhaler Inhale 2 puffs into the lungs every 6 (six) hours as needed for wheezing or shortness of breath. (Patient not taking: Reported on 11/23/2019) 6.7 g 0 Not Taking at Unknown time    No Known Allergies  Review of Systems: Negative except for what is mentioned in HPI.  Physical Exam:  BP 117/81 (BP Location: Right Arm)   Pulse 89   Temp 98.4 F (36.9 C) (Oral)   Resp 15   Ht 5\' 6"  (1.676 m)   Wt 75.8 kg   LMP 02/20/2019 (Approximate)   BMI 26.95 kg/m   GENERAL: Well-developed, well-nourished female in no  acute distress.   LUNGS: Clear to auscultation bilaterally.   HEART: Regular rate and rhythm.  ABDOMEN: Soft, nontender, nondistended, gravid.  EXTREMITIES: Nontender, no edema, 2+ distal pulses.  Cervical Exam: Dilation: 3 Effacement (%): 90 Cervical Position: Middle Station: -2 Presentation: Vertex Exam by:: M Edge RN  FHR Category I  Contractions: Every four (4) to eight (8) minutes, soft resting tone   Pertinent Labs/Studies:   Results for orders placed or performed during the hospital encounter of 11/23/19 (from the past 24 hour(s))  Type and screen [facility]     Status: None (Preliminary result)   Collection Time: 11/23/19 12:23 PM  Result Value Ref Range   ABO/RH(D) PENDING    Antibody Screen PENDING    Sample Expiration      11/26/2019,2359 Performed at [facility], [address] [place], [place]   CBC with Differential/Platelet     Status: Abnormal   Collection Time: 11/23/19 12:23 PM  Result Value Ref Range   WBC 11.3 (H) 4.0 - 10.5 K/uL   RBC 4.60 3.87 - 5.11 MIL/uL   Hemoglobin 11.7 (L) 12.0 - 15.0 g/dL   HCT 11/25/19 (L) 36 - 46 %   MCV 77.2 (L) 80.0 - 100.0 fL   MCH 25.4 (L) 26.0 - 34.0 pg   MCHC 33.0 30.0 - 36.0 g/dL   RDW 37.1 69.6 - 78.9 %   Platelets 285 150 - 400 K/uL   nRBC 0.0 0.0 - 0.2 %   Neutrophils Relative % 77 %   Neutro Abs 8.7 (H) 1.7 - 7.7 K/uL   Lymphocytes Relative 14 %   Lymphs Abs 1.6 0.7 - 4.0 K/uL   Monocytes Relative 8 %   Monocytes Absolute 0.9 0 - 1 K/uL   Eosinophils Relative 1 %   Eosinophils Absolute 0.1 0 - 0 K/uL   Basophils Relative 0 %   Basophils Absolute 0.1 0 - 0 K/uL   Immature Granulocytes 0 %   Abs Immature Granulocytes 0.05 0.00 - 0.07 K/uL    Assessment :  Cindy Brock is a 24 y.o. 30 at [redacted]w[redacted]d being admitted for labor, Rh positive, GBS positive  FHR Category I  Plan:  Admit to birthing suites, see orders.   Labor: Expectant management.     Induction/Augmentation as needed, per protocol.  Delivery plan: Hopeful for vaginal birth in waterbirth tub.   Dr. [redacted]w[redacted]d notified of admission and plan of care.    Valentino Saxon  Jeralyn Bennett, CNM [facility], [facility] 11/23/19 2:32 PM

## 2019-11-23 NOTE — Telephone Encounter (Signed)
Patient admitted to hospital for labor. Thanks, JML

## 2019-11-23 NOTE — Progress Notes (Signed)
Patient ID: Cindy Brock, female   DOB: 1995/10/14, 24 y.o.   MRN: [id number]  Cindy Brock is a 24 y.o. [id number] at [redacted]w[redacted]d by LMP admitted for active labor  Subjective:  Patient reports regular contractions with increasing intensity. FOB and mother of patient present at bedside for continuous labor support.   Denies difficulty breathing or respiratory distress, chest pain, dysuria, and leg pain or swelling.   Objective:  Temp:  [98.2 F (36.8 C)-98.4 F (36.9 C)] 98.4 F (36.9 C) (06/23 1325) Pulse Rate:  [89-119] 89 (06/23 1325) Resp:  [15-16] 15 (06/23 1325) BP: (115-117)/(71-81) 117/81 (06/23 1325) Weight:  [75.8 kg] 75.8 kg (06/23 0729)  FHT:  Category I  UC:   regular, every three (3) to five (5) minutes; soft resting tone  SVE:   Dilation: 6.5 Effacement (%): 90 Station: 0 Exam by:: Jessenia Filippone CNM AROM clear fluid, small amount  Labs: Lab Results  Component Value Date   WBC 11.3 (H) 11/23/2019   HGB 11.7 (L) 11/23/2019   HCT 35.5 (L) 11/23/2019   MCV 77.2 (L) 11/23/2019   PLT 285 11/23/2019    Assessment:  Cindy Brock is a 23 y.o. [id number] at [redacted]w[redacted]d admitted for labor, Rh positive, GBS positive  FHR Category I  Plan:  AROM without difficulty.   Encouraged position change and use of peanut ball.   Reviewed red flag symptoms and when to call.   Continue orders as written. Reassess as needed.   Room prepared for second stage.    Serafina Royals, CNM [facility], [facility] 11/23/2019, 5:12 PM

## 2019-11-23 NOTE — Progress Notes (Signed)
Pt removed EFM stating "I don't want that. It's uncomfortable." RN notified CNM and orders given for therapeutic rest with Pain medication,

## 2019-11-23 NOTE — Progress Notes (Signed)
EFM removed so patient could ambulate in hallway and use birthing ball as needed. RN to recheck cervix in one hour to see if there has been cervical change. Pt agrees with this plan.

## 2019-11-24 ENCOUNTER — Encounter: Payer: Self-pay | Admitting: Radiology

## 2019-11-24 LAB — CBC
HCT: 30.1 % — ABNORMAL LOW (ref 36.0–46.0)
Hemoglobin: 10.1 g/dL — ABNORMAL LOW (ref 12.0–15.0)
MCH: 25.7 pg — ABNORMAL LOW (ref 26.0–34.0)
MCHC: 33.6 g/dL (ref 30.0–36.0)
MCV: 76.6 fL — ABNORMAL LOW (ref 80.0–100.0)
Platelets: 262 10*3/uL (ref 150–400)
RBC: 3.93 MIL/uL (ref 3.87–5.11)
RDW: 14.8 % (ref 11.5–15.5)
WBC: 15.3 10*3/uL — ABNORMAL HIGH (ref 4.0–10.5)
nRBC: 0 % (ref 0.0–0.2)

## 2019-11-24 LAB — RPR: RPR Ser Ql: NONREACTIVE

## 2019-11-24 MED ORDER — IBUPROFEN 600 MG PO TABS: 600.0000 mg | ORAL_TABLET | Freq: Four times a day (QID) | ORAL | 0 refills | Status: DC

## 2019-11-24 MED ORDER — FERROUS SULFATE 325 (65 FE) MG PO TBEC: 325.0000 mg | DELAYED_RELEASE_TABLET | Freq: Two times a day (BID) | ORAL | 3 refills | Status: DC

## 2019-11-24 MED ORDER — ACETAMINOPHEN 325 MG PO TABS: 650.0000 mg | ORAL_TABLET | ORAL | 0 refills | Status: DC | PRN

## 2019-11-24 NOTE — Discharge Instructions (Signed)

## 2019-11-24 NOTE — Discharge Summary (Signed)
Physician Obstetric Discharge Summary  Patient ID: Cindy Brock MRN: [id number] DOB/AGE: 1995-12-24 24 y.o.   Date of Admission: 11/23/2019  Date of Discharge:  11/24/19  Admitting Diagnosis: Onset of Labor at [redacted]w[redacted]d  Secondary Diagnosis: Postpartum anemia due to blood loss, Rubella non-immune, Varicella non-immune, Group Beta Strep Positive, History of marijuana use with negative repeat screen, Rh positive  Mode of Delivery: normal spontaneous vaginal delivery/waterbirth     Discharge Diagnosis: No other diagnosis   Intrapartum Procedures: Atificial rupture of membranes and GBS prophylaxis   Post partum procedures: None  Complications: None   Brief Hospital Course   Cindy Brock is a [id number] who had a SVD on 11/23/2019;  for further details, please refer to the delivery summary.  Patient had an uncomplicated postpartum course.  By time of discharge on PPD#1, her pain was controlled on oral pain medications; she had appropriate lochia and was ambulating, voiding without difficulty and tolerating regular diet.  She was deemed stable for discharge to home.    Labs: CBC Latest Ref Rng & Units 11/24/2019 11/23/2019 10/03/2019  WBC 4.0 - 10.5 K/uL 15.3(H) 11.3(H) 13.7(H)  Hemoglobin 12.0 - 15.0 g/dL 10.1(L) 11.7(L) 12.3  Hematocrit 36 - 46 % 30.1(L) 35.5(L) 36.9  Platelets 150 - 400 K/uL 262 285 255   O POS Performed at [facility], [address] [place], [place] [address]   Physical exam:   Temp:  [98.4 F (36.9 C)-99.2 F (37.3 C)] 98.4 F (36.9 C) (06/24 0845) Pulse Rate:  [72-105] 80 (06/24 0846) Resp:  [14-20] 18 (06/24 0845) BP: (96-127)/(45-90) 106/66 (06/24 0846) SpO2:  [99 %-100 %] 100 % (06/24 0845)  General: alert and no distress  Lochia: appropriate  Abdomen: soft, NT  Uterine Fundus: firm  Perineum: no significant drainage, no dehiscence, no significant erythema  Extremities: No evidence of DVT seen on physical exam. No lower extremity  edema.  Edinburgh Postnatal Depression Scale Screening Tool 11/24/2019  I have been able to laugh and see the funny side of things. 0  I have looked forward with enjoyment to things. 0  I have blamed myself unnecessarily when things went wrong. 0  I have been anxious or worried for no good reason. 0  I have felt scared or panicky for no good reason. 0  Things have been getting on top of me. 0  I have been so unhappy that I have had difficulty sleeping. 0  I have felt sad or miserable. 0  I have been so unhappy that I have been crying. 0  The thought of harming myself has occurred to me. 0  Edinburgh Postnatal Depression Scale Total 0     Discharge Instructions: Per After Visit Summary.  Activity: Advance as tolerated. Pelvic rest for 6 weeks.  Also refer to After Visit Summary  Diet: Regular  Medications: Allergies as of 11/24/2019   No Known Allergies     Medication List    TAKE these medications   acetaminophen 325 MG tablet Commonly known as: Tylenol Take 2 tablets (650 mg total) by mouth every 4 (four) hours as needed for mild pain, moderate pain or headache.   albuterol 108 (90 Base) MCG/ACT inhaler Commonly known as: VENTOLIN HFA Inhale 2 puffs into the lungs every 6 (six) hours as needed for wheezing or shortness of breath.   ferrous sulfate 325 (65 FE) MG EC tablet Take 1 tablet (325 mg total) by mouth 2 (two) times daily with breakfast and lunch.   ibuprofen  600 MG tablet Commonly known as: ADVIL Take 1 tablet (600 mg total) by mouth every 6 (six) hours.   PRENATAL 1 PO Take by mouth.      Outpatient follow up:   Follow-up Information    Gunnar Bulla, CNM Follow up.   Specialties: Certified Nurse Midwife, Obstetrics and Gynecology, Radiology Why: The office will call to schedule your two (2) week televisit and six (6) week postpartum visit Contact information: [address] [place] [address] [phone]               Postpartum contraception: abstinence; will discuss further at postpartum visit  Discharged Condition: stable  Discharged to: home   Newborn Data:  Disposition:home with mother  Apgars: APGAR (1 MIN): 9   APGAR (5 MINS): 9    Baby Feeding: Breast   Serafina Royals, CNM  [facility], [facility] 11/24/19 1:35 PM

## 2019-11-24 NOTE — Progress Notes (Addendum)
Patient discharged with infant. Discharge instructions, prescriptions, and follow-up appointments given to and reviewed with patient. Patient verbalized understanding. IV removed prior to discharge. Escorted out by staff.

## 2019-11-24 NOTE — Plan of Care (Signed)
Adequate for discharge.

## 2019-11-24 NOTE — Lactation Note (Signed)
This note was copied from a baby's chart. Lactation Consultation Note  Patient Name: Cindy Brock [id number] Date: 11/24/2019 Reason for consult: Initial assessment;Term  LC in to see mom and baby. This is mom's second baby, with previous BF experience of 72yr with now 2yr old. Mom reports all feedings to be going well, and has already begun pumping (use of her own personal EBP), and offering every other feeding of expressed milk via bottle.  Mom concerned about pumped milk volumes; now receiving 62mL each side. LC praised mom for her volume and educated on baby's stomach size, normal course of lactation and milk supply and demand. Briefly reviewed milk storage, thawing and warming, mom already comfortable with pump set-up and use. LC reviewed breastfeeding basics: early feeding cues, feeding with cues, growth spurts/cluster feeding, output expectations, changes in color of stool, breast/nipple care, breast fullness and engorgement management, signs of plugged ducts and mastitis and when to seek help. Information for outpatient lactation services given as well as community breastfeeding supports.  Encouraged family to call out with any questions or for BF assistance.  Maternal Data Formula Feeding for Exclusion: No Has patient been taught Hand Expression?: Yes Does the patient have breastfeeding experience prior to this delivery?: Yes  Feeding    LATCH Score                   Interventions Interventions: Breast feeding basics reviewed;DEBP (normal course of lactation)  Lactation Tools Discussed/Used Pump Review: Milk Storage (mom knowing pump set-up and cleaning) Initiated by:: Mom Date initiated:: 11/23/19   Consult Status Consult Status: Complete    Danford Bad 11/24/2019, 3:32 PM

## 2019-11-25 ENCOUNTER — Encounter: Payer: Medicaid Other | Admitting: Certified Nurse Midwife

## 2019-11-28 ENCOUNTER — Telehealth: Payer: Self-pay | Admitting: Certified Nurse Midwife

## 2019-11-28 NOTE — Telephone Encounter (Signed)
Pt called in and stated that they need to make a 2 week visit. I told her its a televisit she stated that she wants to be seen that she thinks she has a prolapsed. I made the appt for the pt

## 2019-12-09 ENCOUNTER — Encounter: Payer: Medicaid Other | Admitting: Certified Nurse Midwife

## 2019-12-29 ENCOUNTER — Telehealth: Payer: Self-pay | Admitting: Certified Nurse Midwife

## 2019-12-29 NOTE — Telephone Encounter (Signed)
Called pt and lmtrc to please call back to schedule a televisit and a 6 week ppv with michelle

## 2020-01-02 ENCOUNTER — Ambulatory Visit (INDEPENDENT_AMBULATORY_CARE_PROVIDER_SITE_OTHER): Payer: Medicaid Other | Admitting: Certified Nurse Midwife

## 2020-01-02 ENCOUNTER — Encounter: Payer: Self-pay | Admitting: Certified Nurse Midwife

## 2020-01-02 NOTE — Progress Notes (Signed)
Subjective:    Cindy Brock is a 24 y.o. [id number] African American female who presents for a postpartum visit. She is 6 weeks postpartum following a spontaneous vaginal delivery at 39+3 gestational weeks. Anesthesia: none. I have fully reviewed the prenatal and intrapartum course.   Postpartum course has been uncomplicated except for single episode of nipple thrush.   Baby's course has been uncomplicated. Baby is feeding by breast.   Bleeding no bleeding. Bowel function is normal. Bladder function is normal. Questions pelvic organ prolapse.   Patient is not sexually active. Contraception method is NFP.   Postpartum depression screening: negative. Score 0.    Last pap unsure.  Denies difficulty breathing or respiratory distress, chest pain, abdominal pain, excessive vaginal bleeding, dysuria, and leg pain or swelling.   The following portions of the patient's history were reviewed and updated as appropriate: allergies, current medications, past medical history, past surgical history and problem list.  Review of Systems  Pertinent items are noted in HPI.   Objective:   BP 96/66   Pulse 87   Ht 5\' 6"  (1.676 m)   Wt 147 lb (66.7 kg)   LMP 12/20/2019 (Approximate)   BMI 23.73 kg/m   General:  alert, cooperative and no distress   Breasts:  deferred, no complaints  Lungs: clear to auscultation bilaterally  Heart:  regular rate and rhythm  Abdomen: soft, nontender   Vulva: normal  Vagina: normal vagina; mild POP present  Cervix:  closed  Corpus: Well-involuted  Adnexa:  Non-palpable   Depression screen [facility] 2/9 01/02/2020  Decreased Interest 0  Down, Depressed, Hopeless 0  PHQ - 2 Score 0  Altered sleeping 0  Tired, decreased energy 0  Change in appetite 0  Feeling bad or failure about yourself  0  Trouble concentrating 0  Moving slowly or fidgety/restless 0  Suicidal thoughts 0  PHQ-9 Score 0  Difficult doing work/chores Not difficult at all   GAD 7 : Generalized  Anxiety Score 01/02/2020  Nervous, Anxious, on Edge 0  Control/stop worrying 0  Worry too much - different things 0  Trouble relaxing 0  Restless 0  Easily annoyed or irritable 0  Afraid - awful might happen 0  Total GAD 7 Score 0  Anxiety Difficulty Not difficult at all    Assessment:   Postpartum exam Six (6) wks s/p spontaneous vaginal birth Breastfeeding Depression screening Contraception counseling   Plan:   Encouraged routine health maintenance techniques.   Referral to physical therapy, see orders.   Reviewed red flag symptoms and when to call.   Follow up in: 3 months for ANNUAL EXAM and PAP or earlier if needed.   03/03/2020, CNM [facility], [facility] 01/02/20 4:33 PM

## 2020-01-02 NOTE — Patient Instructions (Addendum)
Preventive Care 21-24 Years Old, Female Preventive care refers to visits with your health care provider and lifestyle choices that can promote health and wellness. This includes:  A yearly physical exam. This may also be called an annual well check.  Regular dental visits and eye exams.  Immunizations.  Screening for certain conditions.  Healthy lifestyle choices, such as eating a healthy diet, getting regular exercise, not using drugs or products that contain nicotine and tobacco, and limiting alcohol use. What can I expect for my preventive care visit? Physical exam Your health care provider will check your:  Height and weight. This may be used to calculate body mass index (BMI), which tells if you are at a healthy weight.  Heart rate and blood pressure.  Skin for abnormal spots. Counseling Your health care provider may ask you questions about your:  Alcohol, tobacco, and drug use.  Emotional well-being.  Home and relationship well-being.  Sexual activity.  Eating habits.  Work and work environment.  Method of birth control.  Menstrual cycle.  Pregnancy history. What immunizations do I need?  Influenza (flu) vaccine  This is recommended every year. Tetanus, diphtheria, and pertussis (Tdap) vaccine  You may need a Td booster every 10 years. Varicella (chickenpox) vaccine  You may need this if you have not been vaccinated. Human papillomavirus (HPV) vaccine  If recommended by your health care provider, you may need three doses over 6 months. Measles, mumps, and rubella (MMR) vaccine  You may need at least one dose of MMR. You may also need a second dose. Meningococcal conjugate (MenACWY) vaccine  One dose is recommended if you are age 19-21 years and a first-year college student living in a residence hall, or if you have one of several medical conditions. You may also need additional booster doses. Pneumococcal conjugate (PCV13) vaccine  You may need  this if you have certain conditions and were not previously vaccinated. Pneumococcal polysaccharide (PPSV23) vaccine  You may need one or two doses if you smoke cigarettes or if you have certain conditions. Hepatitis A vaccine  You may need this if you have certain conditions or if you travel or work in places where you may be exposed to hepatitis A. Hepatitis B vaccine  You may need this if you have certain conditions or if you travel or work in places where you may be exposed to hepatitis B. Haemophilus influenzae type b (Hib) vaccine  You may need this if you have certain conditions. You may receive vaccines as individual doses or as more than one vaccine together in one shot (combination vaccines). Talk with your health care provider about the risks and benefits of combination vaccines. What tests do I need?  Blood tests  Lipid and cholesterol levels. These may be checked every 5 years starting at age 20.  Hepatitis C test.  Hepatitis B test. Screening  Diabetes screening. This is done by checking your blood sugar (glucose) after you have not eaten for a while (fasting).  Sexually transmitted disease (STD) testing.  BRCA-related cancer screening. This may be done if you have a family history of breast, ovarian, tubal, or peritoneal cancers.  Pelvic exam and Pap test. This may be done every 3 years starting at age 21. Starting at age 30, this may be done every 5 years if you have a Pap test in combination with an HPV test. Talk with your health care provider about your test results, treatment options, and if necessary, the need for more tests.   Follow these instructions at home: Eating and drinking   Eat a diet that includes fresh fruits and vegetables, whole grains, lean protein, and low-fat dairy.  Take vitamin and mineral supplements as recommended by your health care provider.  Do not drink alcohol if: ? Your health care provider tells you not to drink. ? You are  pregnant, may be pregnant, or are planning to become pregnant.  If you drink alcohol: ? Limit how much you have to 0-1 drink a day. ? Be aware of how much alcohol is in your drink. In the [place] one drink equals one 12 oz bottle of beer (355 mL), one 5 oz glass of wine (148 mL), or one 1 oz glass of hard liquor (44 mL). Lifestyle  Take daily care of your teeth and gums.  Stay active. Exercise for at least 30 minutes on 5 or more days each week.  Do not use any products that contain nicotine or tobacco, such as cigarettes, e-cigarettes, and chewing tobacco. If you need help quitting, ask your health care provider.  If you are sexually active, practice safe sex. Use a condom or other form of birth control (contraception) in order to prevent pregnancy and STIs (sexually transmitted infections). If you plan to become pregnant, see your health care provider for a preconception visit. What's next?  Visit your health care provider once a year for a well check visit.  Ask your health care provider how often you should have your eyes and teeth checked.  Stay up to date on all vaccines. This information is not intended to replace advice given to you by your health care provider. Make sure you discuss any questions you have with your health care provider. Document Revised: 01/28/2018 Document Reviewed: 01/28/2018 Elsevier Patient Education  [place].   Kegel Exercises  Kegel exercises can help strengthen your pelvic floor muscles. The pelvic floor is a group of muscles that support your rectum, small intestine, and bladder. In females, pelvic floor muscles also help support the womb (uterus). These muscles help you control the flow of urine and stool. Kegel exercises are painless and simple, and they do not require any equipment. Your provider may suggest Kegel exercises to:  Improve bladder and bowel control.  Improve sexual response.  Improve weak pelvic floor muscles after  surgery to remove the uterus (hysterectomy) or pregnancy (females).  Improve weak pelvic floor muscles after prostate gland removal or surgery (males). Kegel exercises involve squeezing your pelvic floor muscles, which are the same muscles you squeeze when you try to stop the flow of urine or keep from passing gas. The exercises can be done while sitting, standing, or lying down, but it is best to vary your position. Exercises How to do Kegel exercises: 1. Squeeze your pelvic floor muscles tight. You should feel a tight lift in your rectal area. If you are a female, you should also feel a tightness in your vaginal area. Keep your stomach, buttocks, and legs relaxed. 2. Hold the muscles tight for up to 10 seconds. 3. Breathe normally. 4. Relax your muscles. 5. Repeat as told by your health care provider. Repeat this exercise daily as told by your health care provider. Continue to do this exercise for at least 4-6 weeks, or for as long as told by your health care provider. You may be referred to a physical therapist who can help you learn more about how to do Kegel exercises. Depending on your condition, your health care provider may  recommend:  Varying how long you squeeze your muscles.  Doing several sets of exercises every day.  Doing exercises for several weeks.  Making Kegel exercises a part of your regular exercise routine. This information is not intended to replace advice given to you by your health care provider. Make sure you discuss any questions you have with your health care provider. Document Revised: 01/06/2018 Document Reviewed: 01/06/2018 Elsevier Patient Education  [place].  Pelvic Organ Prolapse Pelvic organ prolapse is the stretching, bulging, or dropping of pelvic organs into an abnormal position. It happens when the muscles and tissues that surround and support pelvic structures become weak or stretched. Pelvic organ prolapse can involve the:  Vagina  (vaginal prolapse).  Uterus (uterine prolapse).  Bladder (cystocele).  Rectum (rectocele).  Intestines (enterocele). When organs other than the vagina are involved, they often bulge into the vagina or protrude from the vagina, depending on how severe the prolapse is. What are the causes? This condition may be caused by:  Pregnancy, labor, and childbirth.  Past pelvic surgery.  Decreased production of the hormone estrogen associated with menopause.  Consistently lifting more than 50 lb (23 kg).  Obesity.  Long-term inability to pass stool (chronic constipation).  A cough that lasts a long time (chronic).  Buildup of fluid in the abdomen due to certain diseases and other conditions. What are the signs or symptoms? Symptoms of this condition include:  Passing a little urine (loss of bladder control) when you cough, sneeze, strain, and exercise (stress incontinence). This may be worse immediately after childbirth. It may gradually improve over time.  Feeling pressure in your pelvis or vagina. This pressure may increase when you cough or when you are passing stool.  A bulge that protrudes from the opening of your vagina.  Difficulty passing urine or stool.  Pain in your lower back.  Pain, discomfort, or disinterest in sex.  Repeated bladder infections (urinary tract infections).  Difficulty inserting a tampon. In some people, this condition causes no symptoms. How is this diagnosed? This condition may be diagnosed based on a vaginal and rectal exam. During the exam, you may be asked to cough and strain while you are lying down, sitting, and standing up. Your health care provider will determine if other tests are required, such as bladder function tests. How is this treated? Treatment for this condition may depend on your symptoms. Treatment may include:  Lifestyle changes, such as changes to your diet.  Emptying your bladder at scheduled times (bladder training  therapy). This can help reduce or avoid urinary incontinence.  Estrogen. Estrogen may help mild prolapse by increasing the strength and tone of pelvic floor muscles.  Kegel exercises. These may help mild cases of prolapse by strengthening and tightening the muscles of the pelvic floor.  A soft, flexible device that helps support the vaginal walls and keep pelvic organs in place (pessary). This is inserted into your vagina by your health care provider.  Surgery. This is often the only form of treatment for severe prolapse. Follow these instructions at home:  Avoid drinking beverages that contain caffeine or alcohol.  Increase your intake of high-fiber foods. This can help decrease constipation and straining during bowel movements.  Lose weight if recommended by your health care provider.  Wear a sanitary pad or adult diapers if you have urinary incontinence.  Avoid heavy lifting and straining with exercise and work. Do not hold your breath when you perform mild to moderate lifting and exercise  activities. Limit your activities as directed by your health care provider.  Do Kegel exercises as directed by your health care provider. To do this: ? Squeeze your pelvic floor muscles tight. You should feel a tight lift in your rectal area and a tightness in your vaginal area. Keep your stomach, buttocks, and legs relaxed. ? Hold the muscles tight for up to 10 seconds. ? Relax your muscles. ? Repeat this exercise 50 times a day, or as many times as told by your health care provider. Continue to do this exercise for at least 4-6 weeks, or for as long as told by your health care provider.  Take over-the-counter and prescription medicines only as told by your health care provider.  If you have a pessary, take care of it as told by your health care provider.  Keep all follow-up visits as told by your health care provider. This is important. Contact a health care provider if you:  Have symptoms  that interfere with your daily activities or sex life.  Need medicine to help with the discomfort.  Notice bleeding from your vagina that is not related to your period.  Have a fever.  Have pain or bleeding when you urinate.  Have bleeding when you pass stool.  Pass urine when you have sex.  Have chronic constipation.  Have a pessary that falls out.  Have bad smelling vaginal discharge.  Have an unusual, low pain in your abdomen. Summary  Pelvic organ prolapse is the stretching, bulging, or dropping of pelvic organs into an abnormal position. It happens when the muscles and tissues that surround and support pelvic structures become weak or stretched.  When organs other than the vagina are involved, they often bulge into the vagina or protrude from the vagina, depending on how severe the prolapse is.  In most cases, this condition needs to be treated only if it produces symptoms. Treatment may include lifestyle changes, estrogen, Kegel exercises, pessary insertion, or surgery.  Avoid heavy lifting and straining with exercise and work. Do not hold your breath when you perform mild to moderate lifting and exercise activities. Limit your activities as directed by your health care provider. This information is not intended to replace advice given to you by your health care provider. Make sure you discuss any questions you have with your health care provider. Document Revised: 06/10/2017 Document Reviewed: 06/10/2017 Elsevier Patient Education  2020 [employer].

## 2020-03-26 ENCOUNTER — Other Ambulatory Visit: Payer: Self-pay

## 2020-03-26 MED ORDER — FLUCONAZOLE 150 MG PO TABS
150.0000 mg | ORAL_TABLET | Freq: Every day | ORAL | 1 refills | Status: DC
Start: 2020-03-26 — End: 2020-06-22

## 2020-04-19 ENCOUNTER — Encounter: Payer: Medicaid Other | Admitting: Certified Nurse Midwife

## 2020-05-30 NOTE — Telephone Encounter (Signed)
Pt called in and stated that she went to the bathroom an hour ago and she had some light light she did take tylenol and it brought her fever down to 99.8. The pt is calling in she is worried about her baby. I told her that I will send a message to the nurse. Please advise

## 2020-05-31 NOTE — Telephone Encounter (Signed)
Called pt was unable to leave a message change visit for 31st to a telelvisit.

## 2020-05-31 NOTE — Telephone Encounter (Signed)
Please convert confirmation appointment to TELEVISIT. Thanks, JML

## 2020-06-01 ENCOUNTER — Encounter: Payer: Medicaid Other | Admitting: Certified Nurse Midwife

## 2020-06-02 NOTE — L&D Delivery Note (Signed)
Delivery Summary for Cindy Brock  Labor Events:   Preterm labor: No data found  Rupture date: 12/24/2020  Rupture time: 5:10 AM  Rupture type: Spontaneous  Fluid Color: Clear  Induction: No data found  Augmentation: No data found  Complications: No data found  Cervical ripening: No data found No data found   No data found     Delivery:   Episiotomy: No data found  Lacerations: No data found  Repair suture: No data found  Repair # of packets: No data found  Blood loss (ml): 200 ml   Information for the patient's newborn:  Nickisha, Hum [[id number]]   Delivery 12/24/2020 9:57 AM by  Vaginal, Spontaneous Sex:  female Gestational Age: [redacted]w[redacted]d Delivery Clinician:   Living?:         APGARS  One minute Five minutes Ten minutes  Skin color:        Heart rate:        Grimace:        Muscle tone:        Breathing:        Totals: 8  9      Presentation/position:      Resuscitation:   Cord information:    Disposition of cord blood:     Blood gases sent?  Complications:   Placenta: Delivered:       appearance Newborn Measurements: Weight: 4 lb 10.8 oz (2120 g)  Height: 17.25"  Head circumference:    Chest circumference:    Other providers:    Additional  information: Forceps:   Vacuum:   Breech:   Observed anomalies      Information for the patient's newborn:  Rosaline, Ezekiel [[id number]]   Delivery 12/24/2020 10:18 AM by  Vaginal, Breech Sex:  female Gestational Age: [redacted]w[redacted]d Delivery Clinician:   Living?:         APGARS  One minute Five minutes Ten minutes  Skin color:        Heart rate:        Grimace:        Muscle tone:        Breathing:        Totals: 1  6  9     Presentation/position:      Resuscitation:   Cord information:    Disposition of cord blood:     Blood gases sent?  Complications:   Placenta: Delivered:       appearance Newborn Measurements: Weight: 5 lb 2.5 oz (2340 g)  Height: 17.5"  Head circumference:    Chest circumference:    Other  providers:    Additional  information: Forceps:   Vacuum:   Breech:   Observed anomalies        Delivery Note   Cindy Brock, Cindy Brock  Called to assess patient as she noted strong urge to push.  Cervical exam performed, 10/100/+3. At 9:57 AM a viable female was delivered via Vaginal, Spontaneous (Presentation: Vertex; LOA position) with good maternal pushing effort.  APGAR: 8, 9; weight 4 lb 10.8 oz (2120 g).  Cord: 3-vessel with the following complications: None. Cord clamping delayed.      Cindy Brock, Cindy Brock  After delivery of Twin A, a bedside ultrasound was performed to assess fetal presentation, was still noted to be breech. Decision was made to proceed to the OR for delivery.  At 10:18 AM a viable female was delivered via Vaginal, Breech (Presentation: complete breech) with good maternal effort.  The  last 2-3 minutes of pushing, fetal heart rate was noted to be in the 60s.  APGAR: 1, 6; weight 5 lb 2.5 oz (2340 g).   Placenta status: spontaneously expelled immediately after delivery of Twin B.  Cord:  3/-vessel with the following complications: None.  Delayed cord clamping not observed due to fetal status. Infant handed to NICU nurse in attendance.    Anesthesia:  Nitrous Oxide; IV Fentanyl x 1 dose Episiotomy: None Lacerations: None Suture Repair: None Est. Blood Loss (mL):  200 ml   Mom to postpartum.   Baby A to Couplet care / Skin to Skin.   Baby B to Nursery.  Hildred Laser, MD 12/24/2020, 10:52 PM

## 2020-06-07 ENCOUNTER — Telehealth: Payer: Self-pay

## 2020-06-07 NOTE — Telephone Encounter (Signed)
Pt called on and stated that she has been feeling nausea for 2 weeks and is requesting something sent to the [facility] in [place].  Please advise

## 2020-06-08 ENCOUNTER — Other Ambulatory Visit: Payer: Self-pay | Admitting: Certified Nurse Midwife

## 2020-06-08 MED ORDER — BONJESTA 20-20 MG PO TBCR: EXTENDED_RELEASE_TABLET | ORAL | 1 refills | Status: DC

## 2020-06-08 MED ORDER — DOXYLAMINE-PYRIDOXINE 10-10 MG PO TBEC: DELAYED_RELEASE_TABLET | ORAL | 0 refills | Status: DC

## 2020-06-08 NOTE — Telephone Encounter (Signed)
Pt called no answer LM via VM for pt to return call to speak more about her concerns for calling the office.

## 2020-06-08 NOTE — Telephone Encounter (Signed)
Pt returned call to office. Pt stated that she would like Dicilgis for n/v. Pt also rescheduled her appointment for pregnancy confirmation.

## 2020-06-08 NOTE — Addendum Note (Signed)
Addended by: Silvano Bilis on: 06/08/2020 02:17 PM   Modules accepted: Orders

## 2020-06-08 NOTE — Telephone Encounter (Signed)
Please contact patient and send in Diclegis, Bonjesta or Zofran as desired. Reschedule confirmation visit. Thanks, JML

## 2020-06-19 ENCOUNTER — Telehealth: Payer: Self-pay

## 2020-06-19 MED ORDER — ONDANSETRON HCL 4 MG PO TABS: 4.0000 mg | ORAL_TABLET | Freq: Three times a day (TID) | ORAL | 0 refills | Status: DC | PRN

## 2020-06-19 NOTE — Telephone Encounter (Signed)
Rx Phenergan or Zofran. Thanks, JML

## 2020-06-19 NOTE — Telephone Encounter (Signed)
Zofran erxed.   Pt aware via vm.

## 2020-06-19 NOTE — Telephone Encounter (Signed)
Pt is requesting nausea meds.   11 weeks.   Diclegis is not helping.   [facility]   Pls advise.

## 2020-06-21 NOTE — Patient Instructions (Addendum)
Prochlorperazine tablets What is this medicine? PROCHLORPERAZINE (proe klor PER a zeen) helps to control severe nausea and vomiting. This medicine is also used to treat schizophrenia. It can also help patients who experience anxiety that is not due to psychological illness. This medicine may be used for other purposes; ask your health care provider or pharmacist if you have questions. COMMON BRAND NAME(S): Compazine What should I tell my health care provider before I take this medicine? They need to know if you have any of these conditions:  blockage in your bowel  brain tumor  dementia  diabetes  difficulty swallowing  glaucoma  have trouble controlling your muscles  head injury  heart disease  history of irregular heartbeat  if you often drink alcohol  liver disease  low blood counts, like low white cell, platelet, or red cell counts  low blood pressure  lung or breathing disease, like asthma  Parkinson's disease  prostate disease  seizures  trouble passing urine  an unusual or allergic reaction to prochlorperazine, other medicines, foods, dyes, or preservatives  pregnant or trying to get pregnant  breast-feeding How should I use this medicine? Take this medicine by mouth with a glass of water. Follow the directions on the prescription label. Take your doses at regular intervals. Do not take your medicine more often than directed. Do not stop taking this medicine suddenly. This can cause nausea, vomiting, and dizziness. Ask your doctor or health care professional for advice. Talk to your pediatrician regarding the use of this medicine in children. Special care may be needed. While this drug may be prescribed for children as young as 2 years for selected conditions, precautions do apply. Overdosage: If you think you have taken too much of this medicine contact a poison control center or emergency room at once. NOTE: This medicine is only for you. Do not share  this medicine with others. What if I miss a dose? If you miss a dose, take it as soon as you can. If it is almost time for your next dose, take only that dose. Do not take double or extra doses. What may interact with this medicine? Do not take this medicine with any of the following medications:  cisapride  dofetilide  dronedarone  metoclopramide  pimozide  saquinavir  thioridazine This medicine may also interact with the following medications:  alcohol  antihistamines for allergy, cough, and cold  atropine  certain medicines for anxiety or sleep  certain medicines for bladder problems like oxybutynin, tolterodine  certain medicines for depression like amitriptyline, fluoxetine, sertraline  certain medicines for stomach problems like dicyclomine, hyoscyamine  certain medicines for travel sickness like scopolamine  epinephrine  general anesthetics like halothane, isoflurane, methoxyflurane, propofol  ipratropium  levodopa or other medicines for Parkinson's disease  lithium  medicines for blood pressure  medicines for seizures like phenobarbital, primidone, phenytoin  medicines that relax muscles for surgery  narcotic medicines for pain  propranolol  warfarin This list may not describe all possible interactions. Give your health care provider a list of all the medicines, herbs, non-prescription drugs, or dietary supplements you use. Also tell them if you smoke, drink alcohol, or use illegal drugs. Some items may interact with your medicine. What should I watch for while using this medicine? Visit your health care professional for regular checks on your progress. Tell your health care professional if symptoms do not start to get better or if they get worse. You may get drowsy or dizzy. Do not drive,  use machinery, or do anything that needs mental alertness until you know how this medicine affects you. Do not stand or sit up quickly, especially if you are an  older patient. This reduces the risk of dizzy or fainting spells. Alcohol may interfere with the effect of this medicine. Avoid alcoholic drinks. This drug can cause problems with controlling your body temperature. It can lower the response of your body to cold temperatures. If possible, stay indoors during cold weather. If you must go outdoors, wear warm clothes. It can also lower the response of your body to heat. Do not overheat. Do not over-exercise. Stay out of the sun when possible. If you must be in the sun, wear cool clothing. Drink plenty of water. If you have trouble controlling your body temperature, call your health care provider right away. This medicine may increase blood sugar. Ask your health care provider if changes in diet or medicines are needed if you have diabetes. This medicine can make you more sensitive to the sun. Keep out of the sun. If you cannot avoid being in the sun, wear protective clothing and use sunscreen. Do not use sun lamps or tanning beds/booths. Your mouth may get dry. Chewing sugarless gum or sucking hard candy, and drinking plenty of water may help. Contact your doctor if the problem does not go away or is severe. What side effects may I notice from receiving this medicine? Side effects that you should report to your doctor or health care professional as soon as possible:  allergic reactions like skin rash, itching or hives, swelling of the face, lips, or tongue  abnormal production of milk  breast enlargement in both males and females  changes in vision  chest pain  confusion  fast, irregular heartbeat  fever, chills, sore throat  seizures  signs and symptoms of high blood sugar such as being more thirsty or hungry or having to urinate more than normal. You may also feel very tired or have blurry vision.  signs and symptoms of liver injury like dark yellow or brown urine; general ill feeling or flu-like symptoms; light-colored stools; loss of  appetite; nausea; right upper belly pain; unusually weak or tired; yellowing of the eyes or skin  signs and symptoms of low blood pressure like dizziness; feeling faint or lightheaded, falls; unusually weak or tired  trouble passing urine or change in the amount of urine  trouble swallowing  uncontrollable movements of the arms, face, head, mouth, neck, or upper body  unusual bruising or bleeding  unusually weak or tired Side effects that usually do not require medical attention (report to your doctor or health care professional if they continue or are bothersome):  constipation  drowsiness  dry mouth This list may not describe all possible side effects. Call your doctor for medical advice about side effects. You may report side effects to FDA at 1-800-FDA-1088. Where should I keep my medicine? Keep out of the reach of children. Store at room temperature between 15 and 30 degrees C (59 and 86 degrees F). Protect from light. Throw away any unused medicine after the expiration date. NOTE: This sheet is a summary. It may not cover all possible information. If you have questions about this medicine, talk to your doctor, pharmacist, or health care provider.  2021 Elsevier/Gold Standard (2019-04-12 12:26:11)    Common Medications Safe in Pregnancy  Acne:      Constipation:  Benzoyl Peroxide     Colace  Clindamycin  Dulcolax Suppository  Topica Erythromycin     Fibercon  Salicylic Acid      Metamucil         Miralax AVOID:        Senakot   Accutane    Cough:  Retin-A       Cough Drops  Tetracycline      Phenergan w/ Codeine if Rx  Minocycline      Robitussin (Plain & DM)  Antibiotics:     Crabs/Lice:  Ceclor       RID  Cephalosporins    AVOID:  E-Mycins      Kwell  Keflex  Macrobid/Macrodantin   Diarrhea:  Penicillin      Kao-Pectate  Zithromax      Imodium AD         PUSH FLUIDS AVOID:       Cipro     Fever:  Tetracycline      Tylenol (Regular or  Extra  Minocycline       Strength)  Levaquin      Extra Strength-Do not          Exceed 8 tabs/24 hrs Caffeine:        <286m/day (equiv. To 1 cup of coffee or  approx. 3 12 oz sodas)         Gas: Cold/Hayfever:       Gas-X  Benadryl      Mylicon  Claritin       Phazyme  **Claritin-D        Chlor-Trimeton    Headaches:  Dimetapp      ASA-Free Excedrin  Drixoral-Non-Drowsy     Cold Compress  Mucinex (Guaifenasin)     Tylenol (Regular or Extra  Sudafed/Sudafed-12 Hour     Strength)  **Sudafed PE Pseudoephedrine   Tylenol Cold & Sinus     Vicks Vapor Rub  Zyrtec  **AVOID if Problems With Blood Pressure         Heartburn: Avoid lying down for at least 1 hour after meals  Aciphex      Maalox     Rash:  Milk of Magnesia     Benadryl    Mylanta       1% Hydrocortisone Cream  Pepcid  Pepcid Complete   Sleep Aids:  Prevacid      Ambien   Prilosec       Benadryl  Rolaids       Chamomile Tea  Tums (Limit 4/day)     Unisom         Tylenol PM         Warm milk-add vanilla or  Hemorrhoids:       Sugar for taste  Anusol/Anusol H.C.  (RX: Analapram 2.5%)  Sugar Substitutes:  Hydrocortisone OTC     Ok in moderation  Preparation H      Tucks        Vaseline lotion applied to tissue with wiping    Herpes:     Throat:  Acyclovir      Oragel  Famvir  Valtrex     Vaccines:         Flu Shot Leg Cramps:       *Gardasil  Benadryl      Hepatitis A         Hepatitis B Nasal Spray:       Pneumovax  Saline Nasal Spray     Polio Booster         Tetanus Nausea:  Tuberculosis test or PPD  Vitamin B6 25 mg TID   AVOID:    Dramamine      *Gardasil  Emetrol       Live Poliovirus  Ginger Root 250 mg QID    MMR (measles, mumps &  High Complex Carbs @ Bedtime    rebella)  Sea Bands-Accupressure    Varicella (Chickenpox)  Unisom 1/2 tab TID     *No known complications           If received before Pain:         Known pregnancy;   Darvocet       Resume series  after  Lortab        Delivery  Percocet    Yeast:   Tramadol      Femstat  Tylenol 3      Gyne-lotrimin  Ultram       Monistat  Vicodin           MISC:         All Sunscreens           Hair Coloring/highlights          Insect Repellant's          (Including DEET)         Mystic Tans

## 2020-06-22 ENCOUNTER — Encounter: Payer: Self-pay | Admitting: Certified Nurse Midwife

## 2020-06-22 ENCOUNTER — Ambulatory Visit (INDEPENDENT_AMBULATORY_CARE_PROVIDER_SITE_OTHER): Payer: Medicaid Other | Admitting: Certified Nurse Midwife

## 2020-06-22 ENCOUNTER — Other Ambulatory Visit: Payer: Self-pay

## 2020-06-22 ENCOUNTER — Telehealth: Payer: Self-pay

## 2020-06-22 VITALS — BP 115/76 | HR 101 | Ht 66.0 in | Wt 148.0 lb

## 2020-06-22 DIAGNOSIS — O210 Mild hyperemesis gravidarum: Secondary | ICD-10-CM | POA: Diagnosis not present

## 2020-06-22 DIAGNOSIS — Z3201 Encounter for pregnancy test, result positive: Secondary | ICD-10-CM | POA: Diagnosis not present

## 2020-06-22 DIAGNOSIS — O26811 Pregnancy related exhaustion and fatigue, first trimester: Secondary | ICD-10-CM | POA: Diagnosis not present

## 2020-06-22 DIAGNOSIS — Z8616 Personal history of COVID-19: Secondary | ICD-10-CM

## 2020-06-22 DIAGNOSIS — N926 Irregular menstruation, unspecified: Secondary | ICD-10-CM | POA: Diagnosis not present

## 2020-06-22 LAB — POCT URINE PREGNANCY: Preg Test, Ur: POSITIVE — AB

## 2020-06-22 MED ORDER — PROCHLORPERAZINE MALEATE 10 MG PO TABS
10.0000 mg | ORAL_TABLET | Freq: Four times a day (QID) | ORAL | 0 refills | Status: DC | PRN
Start: 2020-06-22 — End: 2020-08-09

## 2020-06-22 NOTE — Progress Notes (Signed)
I have seen, interviewed, and examined the patient in conjunction with the [employer] and affirm the diagnosis and management plan.   Gunnar Bulla, CNM [facility], [facility] 06/22/20 4:49 PM

## 2020-06-22 NOTE — Telephone Encounter (Signed)
Pt walked out before making her next visit. I called pt and left message the pt needs  1-2 weeks for dating and viability ultrasound, and 2-3 weeks for New ob with JML.

## 2020-06-22 NOTE — Progress Notes (Signed)
PT is present today for confirmation of pregnancy. Pt LMP 04/11/2020 . UPT done today results were positive. Pt stated that she is doing well no complaints.

## 2020-06-22 NOTE — Progress Notes (Signed)
GYN ENCOUNTER NOTE  Subjective:       Cindy Brock is a 25 y.o. G25P1021 female is here for pregnancy confirmation.  Reports nausea with vomiting and fatigue. Questions anemia.    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    Denies difficulty breathing, respiratory distress, chest pain, vaginal bleeding, breast tenderness, and leg swelling or pain.  Confirmation appointment delayed due to history of COVID-19. Feeling better at this time.   Gynecologic History  Patient's last menstrual period was 04/11/2020. Patient is pregnant  EDD: 01/16/2021  Gestational age: 40 week 2 days  Contraception: none  Obstetric History  OB History  Gravida Para Term Preterm AB Living  5 1 1   2 1   SAB IAB Ectopic Multiple Live Births  2       1    # Outcome Date GA Lbr Len/2nd Weight Sex Delivery Anes PTL Lv  5 Current           4 SAB 2015          3 Term 02/24/13   2268 g  Vag-Spont  N LIV  2 SAB 2014          1 Gravida             Past Medical History:  Diagnosis Date  . Asthma   . Hyperthyroidism   . Thyroid disease     Past Surgical History:  Procedure Laterality Date  . NO PAST SURGERIES      Current Outpatient Medications on File Prior to Visit  Medication Sig Dispense Refill  . albuterol (VENTOLIN HFA) 108 (90 Base) MCG/ACT inhaler Inhale 2 puffs into the lungs every 6 (six) hours as needed for wheezing or shortness of breath. 6.7 g 0  . ondansetron (ZOFRAN) 4 MG tablet Take 1 tablet (4 mg total) by mouth every 8 (eight) hours as needed for nausea or vomiting. 20 tablet 0  . Prenatal MV-Min-Fe Fum-FA-DHA (PRENATAL 1 PO) Take by mouth.     No current facility-administered medications on file prior to visit.    No Known Allergies  Social History   Socioeconomic History  . Marital status: Significant Other    Spouse name: Chevaughn  . Number of children: Not on file  . Years  of education: Not on file  . Highest education level: Not on file  Occupational History  . Not on file  Tobacco Use  . Smoking status: Never Smoker  . Smokeless tobacco: Never Used  Vaping Use  . Vaping Use: Never used  Substance and Sexual Activity  . Alcohol use: Not Currently    Comment: social   . Drug use: Not Currently    Types: Marijuana  . Sexual activity: Yes    Birth control/protection: None  Other Topics Concern  . Not on file  Social History Narrative  . Not on file   Social Determinants of Health   Financial Resource Strain: Not on file  Food Insecurity: Not on file  Transportation Needs: Not on file  Physical Activity: Not on file  Stress: Not on file  Social Connections: Not on file  Intimate Partner Violence: Not on file    Family History  Problem Relation Age of Onset  . Rheum arthritis Mother   . Hypothyroidism Maternal Grandmother     The following portions of the patient's history were reviewed and updated as appropriate: allergies, current medications,  past family history, past medical history, past social history, past surgical history and problem list.  Review of Systems  ROS- Negative other than what was reported above. Information obtained verbally from patient.   Objective:   BP 115/76   Pulse (!) 101   Ht 5\' 6"  (1.676 m)   Wt 67.1 kg   LMP 04/11/2020   BMI 23.89 kg/m    CONSTITUTIONAL: Well-developed, well-nourished female in no acute distress.   PHYSICAL EXAM: Not indicated.   Recent Results (from the past 2160 hour(s))  POCT urine pregnancy     Status: Abnormal   Collection Time: 06/22/20  3:27 PM  Result Value Ref Range   Preg Test, Ur Positive (A) Negative    Assessment:   1. Missed menses - POCT urine pregnancy   2. Positive urine pregnancy test  3. Morning sickness  4. Fatigue  5. History of COVID-19  Plan:   Encouraged to take prenatal vitamin with DHA and folic acid.   Rx compazine, see  orders.  First trimester education, see AVS.   Anticipatory guidance regarding course of prenatal care.  Reviewed red flag symptoms and when to call.   RTC x 1-2 weeks for dating and viability ultrasound and nurse intake.   RTC x 2-3 weeks for New OB PE with JML or sooner if needed.    06/24/20, [employer] 06/22/20 4:13 PM

## 2020-07-04 ENCOUNTER — Other Ambulatory Visit: Payer: Self-pay

## 2020-07-04 ENCOUNTER — Other Ambulatory Visit: Payer: Self-pay | Admitting: Obstetrics and Gynecology

## 2020-07-04 ENCOUNTER — Other Ambulatory Visit: Payer: Medicaid Other

## 2020-07-04 ENCOUNTER — Ambulatory Visit (INDEPENDENT_AMBULATORY_CARE_PROVIDER_SITE_OTHER): Payer: Medicaid Other

## 2020-07-04 DIAGNOSIS — O30041 Twin pregnancy, dichorionic/diamniotic, first trimester: Secondary | ICD-10-CM

## 2020-07-04 DIAGNOSIS — Z3A11 11 weeks gestation of pregnancy: Secondary | ICD-10-CM

## 2020-07-04 DIAGNOSIS — Z3201 Encounter for pregnancy test, result positive: Secondary | ICD-10-CM | POA: Diagnosis not present

## 2020-07-04 DIAGNOSIS — N926 Irregular menstruation, unspecified: Secondary | ICD-10-CM | POA: Diagnosis not present

## 2020-07-12 ENCOUNTER — Other Ambulatory Visit (HOSPITAL_COMMUNITY)
Admission: RE | Admit: 2020-07-12 | Discharge: 2020-07-12 | Disposition: A | Discharge status: Home / Self Care | Payer: Medicaid Other | Source: Ambulatory Visit | Attending: Certified Nurse Midwife | Admitting: Certified Nurse Midwife

## 2020-07-12 ENCOUNTER — Encounter: Payer: Medicaid Other | Admitting: Certified Nurse Midwife

## 2020-07-12 ENCOUNTER — Encounter: Payer: Self-pay | Admitting: Certified Nurse Midwife

## 2020-07-12 ENCOUNTER — Ambulatory Visit (INDEPENDENT_AMBULATORY_CARE_PROVIDER_SITE_OTHER): Payer: Medicaid Other | Admitting: Certified Nurse Midwife

## 2020-07-12 ENCOUNTER — Other Ambulatory Visit: Payer: Self-pay

## 2020-07-12 ENCOUNTER — Telehealth: Payer: Self-pay | Admitting: Certified Nurse Midwife

## 2020-07-12 VITALS — BP 125/86 | HR 113 | Wt 148.0 lb

## 2020-07-12 DIAGNOSIS — Z124 Encounter for screening for malignant neoplasm of cervix: Secondary | ICD-10-CM

## 2020-07-12 DIAGNOSIS — Z3A13 13 weeks gestation of pregnancy: Secondary | ICD-10-CM

## 2020-07-12 DIAGNOSIS — Z1379 Encounter for other screening for genetic and chromosomal anomalies: Secondary | ICD-10-CM

## 2020-07-12 DIAGNOSIS — O30041 Twin pregnancy, dichorionic/diamniotic, first trimester: Secondary | ICD-10-CM | POA: Diagnosis present

## 2020-07-12 DIAGNOSIS — E0590 Thyrotoxicosis, unspecified without thyrotoxic crisis or storm: Secondary | ICD-10-CM

## 2020-07-12 DIAGNOSIS — O09891 Supervision of other high risk pregnancies, first trimester: Secondary | ICD-10-CM | POA: Diagnosis not present

## 2020-07-12 DIAGNOSIS — R42 Dizziness and giddiness: Secondary | ICD-10-CM

## 2020-07-12 DIAGNOSIS — O99281 Endocrine, nutritional and metabolic diseases complicating pregnancy, first trimester: Secondary | ICD-10-CM

## 2020-07-12 DIAGNOSIS — O26811 Pregnancy related exhaustion and fatigue, first trimester: Secondary | ICD-10-CM

## 2020-07-12 NOTE — Patient Instructions (Signed)
WHAT OB PATIENTS CAN EXPECT   Confirmation of pregnancy and ultrasound ordered if medically indicated-[redacted] weeks gestation  New OB (NOB) intake with nurse and New OB (NOB) labs- [redacted] weeks gestation  New OB (NOB) physical examination with provider- 11/[redacted] weeks gestation  Flu vaccine-[redacted] weeks gestation  Anatomy scan-[redacted] weeks gestation  Glucose tolerance test, blood work to test for anemia, T-dap vaccine-[redacted] weeks gestation  Vaginal swabs/cultures-STD/Group B strep-[redacted] weeks gestation  Appointments every 4 weeks until 28 weeks  Every 2 weeks from 28 weeks until 36 weeks  Weekly visits from 36 weeks until delivery    Common Medications Safe in Pregnancy  Acne:      Constipation:  Benzoyl Peroxide     Colace  Clindamycin      Dulcolax Suppository  Topica Erythromycin     Fibercon  Salicylic Acid      Metamucil         Miralax AVOID:        Senakot   Accutane    Cough:  Retin-A       Cough Drops  Tetracycline      Phenergan w/ Codeine if Rx  Minocycline      Robitussin (Plain & DM)  Antibiotics:     Crabs/Lice:  Ceclor       RID  Cephalosporins    AVOID:  E-Mycins      Kwell  Keflex  Macrobid/Macrodantin   Diarrhea:  Penicillin      Kao-Pectate  Zithromax      Imodium AD         PUSH FLUIDS AVOID:       Cipro     Fever:  Tetracycline      Tylenol (Regular or Extra  Minocycline       Strength)  Levaquin      Extra Strength-Do not          Exceed 8 tabs/24 hrs Caffeine:        <298m/day (equiv. To 1 cup of coffee or  approx. 3 12 oz sodas)         Gas: Cold/Hayfever:       Gas-X  Benadryl      Mylicon  Claritin       Phazyme  **Claritin-D        Chlor-Trimeton    Headaches:  Dimetapp      ASA-Free Excedrin  Drixoral-Non-Drowsy     Cold Compress  Mucinex (Guaifenasin)     Tylenol (Regular or Extra  Sudafed/Sudafed-12 Hour     Strength)  **Sudafed PE Pseudoephedrine   Tylenol Cold & Sinus     Vicks Vapor Rub  Zyrtec  **AVOID if Problems With Blood  Pressure         Heartburn: Avoid lying down for at least 1 hour after meals  Aciphex      Maalox     Rash:  Milk of Magnesia     Benadryl    Mylanta       1% Hydrocortisone Cream  Pepcid  Pepcid Complete   Sleep Aids:  Prevacid      Ambien   Prilosec       Benadryl  Rolaids       Chamomile Tea  Tums (Limit 4/day)     Unisom         Tylenol PM         Warm milk-add vanilla or  Hemorrhoids:       Sugar for taste  Anusol/Anusol H.C.  (RX:  Analapram 2.5%)  Sugar Substitutes:  Hydrocortisone OTC     Ok in moderation  Preparation H      Tucks        Vaseline lotion applied to tissue with wiping    Herpes:     Throat:  Acyclovir      Oragel  Famvir  Valtrex     Vaccines:         Flu Shot Leg Cramps:       *Gardasil  Benadryl      Hepatitis A         Hepatitis B Nasal Spray:       Pneumovax  Saline Nasal Spray     Polio Booster         Tetanus Nausea:       Tuberculosis test or PPD  Vitamin B6 25 mg TID   AVOID:    Dramamine      *Gardasil  Emetrol       Live Poliovirus  Ginger Root 250 mg QID    MMR (measles, mumps &  High Complex Carbs @ Bedtime    rebella)  Sea Bands-Accupressure    Varicella (Chickenpox)  Unisom 1/2 tab TID     *No known complications           If received before Pain:         Known pregnancy;   Darvocet       Resume series after  Lortab        Delivery  Percocet    Yeast:   Tramadol      Femstat  Tylenol 3      Gyne-lotrimin  Ultram       Monistat  Vicodin           MISC:         All Sunscreens           Hair Coloring/highlights          Insect Repellant's          (Including DEET)         Mystic Tans   Second Trimester of Pregnancy  The second trimester of pregnancy is from week 13 through week 27. This is also called months 4 through 6 of pregnancy. This is often the time when you feel your best. During the second trimester:  Morning sickness is less or has stopped.  You may have more energy.  You may feel hungry more  often. At this time, your unborn baby (fetus) is growing very fast. At the end of the sixth month, the unborn baby may be up to 12 inches long and weigh about 1 pounds. You will likely start to feel the baby move between 16 and 20 weeks of pregnancy. Body changes during your second trimester Your body continues to go through many changes during this time. The changes vary and generally return to normal after the baby is born. Physical changes  You will gain more weight.  You may start to get stretch marks on your hips, belly (abdomen), and breasts.  Your breasts will grow and may hurt.  Dark spots or blotches may develop on your face.  A dark line from your belly button to the pubic area (linea nigra) may appear.  You may have changes in your hair. Health changes  You may have headaches.  You may have heartburn.  You may have trouble pooping (constipation).  You may have hemorrhoids or swollen, bulging veins (varicose veins).  Your   gums may bleed.  You may pee (urinate) more often.  You may have back pain. Follow these instructions at home: Medicines  Take over-the-counter and prescription medicines only as told by your doctor. Some medicines are not safe during pregnancy.  Take a prenatal vitamin that contains at least 600 micrograms (mcg) of folic acid. Eating and drinking  Eat healthy meals that include: ? Fresh fruits and vegetables. ? Whole grains. ? Good sources of protein, such as meat, eggs, or tofu. ? Low-fat dairy products.  Avoid raw meat and unpasteurized juice, milk, and cheese.  You may need to take these actions to prevent or treat trouble pooping: ? Drink enough fluids to keep your pee (urine) pale yellow. ? Eat foods that are high in fiber. These include beans, whole grains, and fresh fruits and vegetables. ? Limit foods that are high in fat and sugar. These include fried or sweet foods. Activity  Exercise only as told by your doctor. Most  people can do their usual exercise during pregnancy. Try to exercise for 30 minutes at least 5 days a week.  Stop exercising if you have pain or cramps in your belly or lower back.  Do not exercise if it is too hot or too humid, or if you are in a place of great height (high altitude).  Avoid heavy lifting.  If you choose to, you may have sex unless your doctor tells you not to. Relieving pain and discomfort  Wear a good support bra if your breasts are sore.  Take warm water baths (sitz baths) to soothe pain or discomfort caused by hemorrhoids. Use hemorrhoid cream if your doctor approves.  Rest with your legs raised (elevated) if you have leg cramps or low back pain.  If you develop bulging veins in your legs: ? Wear support hose as told by your doctor. ? Raise your feet for 15 minutes, 3-4 times a day. ? Limit salt in your food. Safety  Wear your seat belt at all times when you are in a car.  Talk with your doctor if someone is hurting you or yelling at you a lot. Lifestyle  Do not use hot tubs, steam rooms, or saunas.  Do not douche. Do not use tampons or scented sanitary pads.  Avoid cat litter boxes and soil used by cats. These carry germs that can harm your baby and can cause a loss of your baby by miscarriage or stillbirth.  Do not use herbal medicines, illegal drugs, or medicines that are not approved by your doctor. Do not drink alcohol.  Do not smoke or use any products that contain nicotine or tobacco. If you need help quitting, ask your doctor. General instructions  Keep all follow-up visits. This is important.  Ask your doctor about local prenatal classes.  Ask your doctor about the right foods to eat or for help finding a counselor. Where to find more information  American Pregnancy Association: americanpregnancy.org  American College of Obstetricians and Gynecologists: www.acog.org  Office on Women's Health: womenshealth.gov/pregnancy Contact a doctor  if:  You have a headache that does not go away when you take medicine.  You have changes in how you see, or you see spots in front of your eyes.  You have mild cramps, pressure, or pain in your lower belly.  You continue to feel like you may vomit (nauseous), you vomit, or you have watery poop (diarrhea).  You have bad-smelling fluid coming from your vagina.  You have pain when you   pee or your pee smells bad.  You have very bad swelling of your face, hands, ankles, feet, or legs.  You have a fever. Get help right away if:  You are leaking fluid from your vagina.  You have spotting or bleeding from your vagina.  You have very bad belly cramping or pain.  You have trouble breathing.  You have chest pain.  You faint.  You have not felt your baby move for the time period told by your doctor.  You have new or increased pain, swelling, or redness in an arm or leg. Summary  The second trimester of pregnancy is from week 13 through week 27 (months 4 through 6).  Eat healthy meals.  Exercise as told by your doctor. Most people can do their usual exercise during pregnancy.  Do not use herbal medicines, illegal drugs, or medicines that are not approved by your doctor. Do not drink alcohol.  Call your doctor if you get sick or if you notice anything unusual about your pregnancy. This information is not intended to replace advice given to you by your health care provider. Make sure you discuss any questions you have with your health care provider. Document Revised: 10/26/2019 Document Reviewed: 09/01/2019 Elsevier Patient Education  2021 Elsevier Inc.  

## 2020-07-12 NOTE — Telephone Encounter (Signed)
Telephone call to patient, HIPPA approved message to identified line asking patient to call back.    Cindy Brock, CNM [facility], [facility] 07/12/20 11:26 AM

## 2020-07-12 NOTE — Progress Notes (Signed)
NOB-Pt present for initial prenatal care and PE. Pt stated that she has been dizzy and feeling lightheaded and think it maybe her iron level.

## 2020-07-12 NOTE — Progress Notes (Signed)
NEW OB HISTORY AND PHYSICAL  SUBJECTIVE:       Cindy Brock is a 25 y.o. [phone] female, Patient's last menstrual period was 04/11/2020., Estimated Date of Delivery: 01/16/21, [redacted]w[redacted]d, presents today for establishment of Prenatal Care.  Reports nausea with occasional vomiting, intermittent dizziness with lightheadedness.   History of hyperthyroidism.   Denies difficulty breathing or respiratory distress, chest pain, abdominal pain, vaginal bleeding, dysuria, and leg pain or swelling.   Prefers midwifery care. Desires genetic screening.    Gynecologic History  Patient's last menstrual period was 04/11/2020.   Contraception: none  Last Pap: unsure.   Obstetric History  OB History  Gravida Para Term Preterm AB Living  5 2 2   2 2   SAB IAB Ectopic Multiple Live Births  2       2    # Outcome Date GA Lbr Len/2nd Weight Sex Delivery Anes PTL Lv  5 Current           4 Term 11/23/19 [redacted]w[redacted]d   F Vag-Spont  Y LIV  3 SAB 2015          2 Term 02/24/13   5 lb (2.268 kg)  Vag-Spont  N LIV  1 SAB 2014            Past Medical History:  Diagnosis Date  . Asthma   . Hyperthyroidism   . Thyroid disease     Past Surgical History:  Procedure Laterality Date  . NO PAST SURGERIES      Current Outpatient Medications on File Prior to Visit  Medication Sig Dispense Refill  . albuterol (VENTOLIN HFA) 108 (90 Base) MCG/ACT inhaler Inhale 2 puffs into the lungs every 6 (six) hours as needed for wheezing or shortness of breath. 6.7 g 0  . Prenatal MV-Min-Fe Fum-FA-DHA (PRENATAL 1 PO) Take by mouth.    . prochlorperazine (COMPAZINE) 10 MG tablet Take 1 tablet (10 mg total) by mouth every 6 (six) hours as needed for nausea or vomiting. 30 tablet 0   No current facility-administered medications on file prior to visit.    No Known Allergies  Social History   Socioeconomic History  . Marital status: Significant Other    Spouse name: Chevaughn  . Number of children: Not on file  . Years of  education: Not on file  . Highest education level: Not on file  Occupational History  . Not on file  Tobacco Use  . Smoking status: Never Smoker  . Smokeless tobacco: Never Used  Vaping Use  . Vaping Use: Never used  Substance and Sexual Activity  . Alcohol use: Not Currently    Comment: social   . Drug use: Not Currently    Types: Marijuana  . Sexual activity: Yes    Birth control/protection: None  Other Topics Concern  . Not on file  Social History Narrative  . Not on file   Social Determinants of Health   Financial Resource Strain: Not on file  Food Insecurity: Not on file  Transportation Needs: Not on file  Physical Activity: Not on file  Stress: Not on file  Social Connections: Not on file  Intimate Partner Violence: Not on file    Family History  Problem Relation Age of Onset  . Rheum arthritis Mother   . Hypothyroidism Maternal Grandmother     The following portions of the patient's history were reviewed and updated as appropriate: allergies, current medications, past OB history, past medical history, past surgical history, past family history,  past social history, and problem list.  Review of Systems:  ROS negative except as noted above. Information obtained from patient.   OBJECTIVE:  BP 125/86   Pulse (!) 113   Wt 148 lb (67.1 kg)   LMP 04/11/2020   BMI 23.89 kg/m   Initial Physical Exam (New OB)  GENERAL APPEARANCE: alert, well appearing, in no apparent distress  HEAD: normocephalic, atraumatic  THYROID: enlarged  BREASTS: deferred, no complaints  LUNGS: clear to auscultation, no wheezes, rales or rhonchi, symmetric air entry  HEART: regular rate and rhythm, no murmurs  ABDOMEN: soft, nontender, nondistended, no abnormal masses, no epigastric pain and FHT present  EXTREMITIES: no redness or tenderness in the calves or thighs, no edema  SKIN: normal coloration and turgor, no rashes  NEUROLOGIC: alert, oriented, normal speech, no focal  findings or movement disorder noted  PELVIC EXAM EXTERNAL GENITALIA: normal appearing vulva with no masses, tenderness or lesions VAGINA: no abnormal discharge or lesions CERVIX: no lesions or cervical motion tenderness and pap collected  ASSESSMENT: Dichorionic diamniotic twin pregnancy 13 weeks  Dizziness Lightheadedness Screening cervical cancer Hyperthyroidism  Genetic screening  PLAN: Prenatal care New OB counseling: The patient has been given an overview regarding routine prenatal care. Recommendations regarding diet, weight gain, and exercise in pregnancy were given. Prenatal testing, optional genetic testing, and ultrasound use in pregnancy were reviewed.  Benefits of Breast Feeding were discussed. The patient is encouraged to consider nursing her baby post partum. See orders  Discussed recommendations for twin pregnancy and information given to patient. Agrees to consider, declined 81 mg Aspirin at this time.

## 2020-07-13 LAB — TSH+T4F+T3FREE
Free T4: 3.18 ng/dL — ABNORMAL HIGH (ref 0.82–1.77)
T3, Free: 9.2 pg/mL — ABNORMAL HIGH (ref 2.0–4.4)
TSH: <0.005 u[IU]/mL — ABNORMAL LOW (ref 0.450–4.500)

## 2020-07-13 LAB — CBC
Hematocrit: 37.6 % (ref 34.0–46.6)
Hemoglobin: 12.8 g/dL (ref 11.1–15.9)
MCH: 27.3 pg (ref 26.6–33.0)
MCHC: 34 g/dL (ref 31.5–35.7)
MCV: 80 fL (ref 79–97)
Platelets: 314 10*3/uL (ref 150–450)
RBC: 4.69 x10E6/uL (ref 3.77–5.28)
RDW: 13.8 % (ref 11.7–15.4)
WBC: 9.2 10*3/uL (ref 3.4–10.8)

## 2020-07-13 LAB — FERRITIN: Ferritin: 25 ng/mL (ref 15–150)

## 2020-07-13 LAB — B12 AND FOLATE PANEL
Folate: 13.7 ng/mL (ref >3.0)
Vitamin B-12: 451 pg/mL (ref 232–1245)

## 2020-07-14 LAB — URINE CULTURE, OB REFLEX

## 2020-07-14 LAB — CULTURE, OB URINE

## 2020-07-16 NOTE — Progress Notes (Signed)
You can either send to Endocrinology or go ahead and treat.  Whichever one you think she will be more amenable to.

## 2020-07-16 NOTE — Progress Notes (Signed)
See Thyroid Panel. Thanks, JML

## 2020-07-16 NOTE — Progress Notes (Signed)
Repeat in second trimester? Or send to Endocrinology for management now? Thanks, JML

## 2020-07-16 NOTE — Progress Notes (Signed)
Looks like she's got hyperthyroidism, right now she would need treatment based on these labs. May be due to the twin pregnancy and could potentially improve over time or may stay elevated her whole pregnancy. But she also had some subclinical levels last pregnancy.

## 2020-07-18 LAB — CYTOLOGY - PAP: Diagnosis: NEGATIVE

## 2020-07-19 ENCOUNTER — Telehealth: Payer: Self-pay | Admitting: Certified Nurse Midwife

## 2020-07-19 ENCOUNTER — Other Ambulatory Visit: Payer: Self-pay | Admitting: Certified Nurse Midwife

## 2020-07-19 DIAGNOSIS — E0590 Thyrotoxicosis, unspecified without thyrotoxic crisis or storm: Secondary | ICD-10-CM

## 2020-07-19 DIAGNOSIS — O30041 Twin pregnancy, dichorionic/diamniotic, first trimester: Secondary | ICD-10-CM

## 2020-07-19 DIAGNOSIS — O99282 Endocrine, nutritional and metabolic diseases complicating pregnancy, second trimester: Secondary | ICD-10-CM

## 2020-07-19 DIAGNOSIS — Z3A14 14 weeks gestation of pregnancy: Secondary | ICD-10-CM

## 2020-07-19 MED ORDER — METHIMAZOLE 5 MG PO TABS: 5.0000 mg | ORAL_TABLET | Freq: Every day | ORAL | 0 refills | Status: DC

## 2020-07-19 NOTE — Telephone Encounter (Signed)
New message   Patient asking for a call back genetic results.

## 2020-07-19 NOTE — Progress Notes (Signed)
Is it possible to add CMP to blood work already collected? Thanks, JML

## 2020-07-19 NOTE — Progress Notes (Signed)
Rx Methimazole, see orders; Per [facility] Hyperthyroidism Algorithm after consultation with Dr. Valentino Saxon.    Cindy Brock, CNM [facility], [facility] 07/19/20 6:02 PM

## 2020-07-19 NOTE — Telephone Encounter (Signed)
Telephone call to patient, HIPPA approved message left on identified line.   Advised patient would send detailed message via MyChart.    Serafina Royals, CNM [facility], [facility] 07/19/20 5:57 PM

## 2020-07-19 NOTE — Telephone Encounter (Signed)
   Pt aware panorama could take 21 business days.   Pt would like to start meds for her thyroid. Spoke to [facility]- advised that JML will call her after clinic today. Pt aware.

## 2020-07-26 ENCOUNTER — Other Ambulatory Visit: Payer: Self-pay | Admitting: Certified Nurse Midwife

## 2020-07-26 DIAGNOSIS — R002 Palpitations: Secondary | ICD-10-CM

## 2020-07-26 DIAGNOSIS — E0590 Thyrotoxicosis, unspecified without thyrotoxic crisis or storm: Secondary | ICD-10-CM

## 2020-07-26 DIAGNOSIS — O99282 Endocrine, nutritional and metabolic diseases complicating pregnancy, second trimester: Secondary | ICD-10-CM

## 2020-07-26 NOTE — Progress Notes (Signed)
Referral to Endocrinology, see orders.    Serafina Royals, CNM [facility], [facility] 07/26/20 9:05 AM

## 2020-07-26 NOTE — Telephone Encounter (Signed)
May have work note. Thanks, JML

## 2020-07-26 NOTE — Telephone Encounter (Signed)
Referral placed, see chart. Thanks, JML

## 2020-07-30 NOTE — Telephone Encounter (Signed)
May provide with work note until Endocrinology appt. Thanks, JML

## 2020-07-30 NOTE — Telephone Encounter (Signed)
When is her appointment with Endocrinology? Does she need to go ahead and file for intermittent FMLA? Thanks, JML

## 2020-08-09 ENCOUNTER — Telehealth: Payer: Self-pay | Admitting: Certified Nurse Midwife

## 2020-08-09 ENCOUNTER — Ambulatory Visit (INDEPENDENT_AMBULATORY_CARE_PROVIDER_SITE_OTHER): Payer: Medicaid Other | Admitting: Certified Nurse Midwife

## 2020-08-09 ENCOUNTER — Other Ambulatory Visit: Payer: Self-pay

## 2020-08-09 ENCOUNTER — Encounter: Payer: Medicaid Other | Admitting: Certified Nurse Midwife

## 2020-08-09 ENCOUNTER — Encounter: Payer: Self-pay | Admitting: Certified Nurse Midwife

## 2020-08-09 VITALS — BP 121/80 | HR 111 | Wt 151.6 lb

## 2020-08-09 DIAGNOSIS — O30042 Twin pregnancy, dichorionic/diamniotic, second trimester: Secondary | ICD-10-CM

## 2020-08-09 DIAGNOSIS — Z3A17 17 weeks gestation of pregnancy: Secondary | ICD-10-CM

## 2020-08-09 DIAGNOSIS — E0590 Thyrotoxicosis, unspecified without thyrotoxic crisis or storm: Secondary | ICD-10-CM

## 2020-08-09 DIAGNOSIS — B3731 Acute candidiasis of vulva and vagina: Secondary | ICD-10-CM

## 2020-08-09 DIAGNOSIS — O99282 Endocrine, nutritional and metabolic diseases complicating pregnancy, second trimester: Secondary | ICD-10-CM

## 2020-08-09 DIAGNOSIS — O30041 Twin pregnancy, dichorionic/diamniotic, first trimester: Secondary | ICD-10-CM

## 2020-08-09 DIAGNOSIS — Z3689 Encounter for other specified antenatal screening: Secondary | ICD-10-CM

## 2020-08-09 DIAGNOSIS — B373 Candidiasis of vulva and vagina: Secondary | ICD-10-CM

## 2020-08-09 LAB — POCT URINALYSIS DIPSTICK OB
Bilirubin, UA: NEGATIVE
Blood, UA: NEGATIVE
Glucose, UA: NEGATIVE
Ketones, UA: NEGATIVE
Leukocytes, UA: NEGATIVE
Nitrite, UA: NEGATIVE
POC,PROTEIN,UA: NEGATIVE
Spec Grav, UA: 1.015 (ref 1.010–1.025)
Urobilinogen, UA: 0.2 E.U./dL
pH, UA: 7.5 (ref 5.0–8.0)

## 2020-08-09 MED ORDER — TERCONAZOLE 0.4 % VA CREA
1.0000 | TOPICAL_CREAM | Freq: Every day | VAGINAL | 0 refills | Status: DC
Start: 2020-08-09 — End: 2020-09-11

## 2020-08-09 NOTE — Progress Notes (Signed)
ROB- Doing well overall. Desires treatment for yeast infection found during pap smear; Rx Terazol, see orders. Discussed importance of managing hyperthyroidism during pregnancy, scheduled for appointment with Endocrinology tomorrow. Anticipatory guidance regarding course of prenatal care. Reviewed red flag symptoms and when to call. Referral to MFM for ANATOMY SCAN, see orders. RTC x 4 weeks for ROB with JML or sooner if needed.  Juliann Pares, Student-MidWife  [employer] 08/09/20 2:36 PM

## 2020-08-09 NOTE — Progress Notes (Signed)
ROB- with no complaints.  Wanting meds for Yeast infection, pt states no sx's.

## 2020-08-09 NOTE — Telephone Encounter (Signed)
Telephone call to patient, verified full name and date of birth.   Patient just got home from work and unable to make appointment, requests afternoon appointment.   Appointment time changed to 1330, patient agrees.    Cindy Brock, CNM [facility], [facility] 08/09/20 10:44 AM

## 2020-08-09 NOTE — Patient Instructions (Addendum)
Second Trimester of Pregnancy  The second trimester of pregnancy is from week 13 through week 27. This is also called months 4 through 6 of pregnancy. This is often the time when you feel your best. During the second trimester:  Morning sickness is less or has stopped.  You may have more energy.  You may feel hungry more often. At this time, your unborn baby (fetus) is growing very fast. At the end of the sixth month, the unborn baby may be up to 12 inches long and weigh about 1 pounds. You will likely start to feel the baby move between 16 and 20 weeks of pregnancy. Body changes during your second trimester Your body continues to go through many changes during this time. The changes vary and generally return to normal after the baby is born. Physical changes  You will gain more weight.  You may start to get stretch marks on your hips, belly (abdomen), and breasts.  Your breasts will grow and may hurt.  Dark spots or blotches may develop on your face.  A dark line from your belly button to the pubic area (linea nigra) may appear.  You may have changes in your hair. Health changes  You may have headaches.  You may have heartburn.  You may have trouble pooping (constipation).  You may have hemorrhoids or swollen, bulging veins (varicose veins).  Your gums may bleed.  You may pee (urinate) more often.  You may have back pain. Follow these instructions at home: Medicines  Take over-the-counter and prescription medicines only as told by your doctor. Some medicines are not safe during pregnancy.  Take a prenatal vitamin that contains at least 600 micrograms (mcg) of folic acid. Eating and drinking  Eat healthy meals that include: ? Fresh fruits and vegetables. ? Whole grains. ? Good sources of protein, such as meat, eggs, or tofu. ? Low-fat dairy products.  Avoid raw meat and unpasteurized juice, milk, and cheese.  You may need to take these actions to prevent or  treat trouble pooping: ? Drink enough fluids to keep your pee (urine) pale yellow. ? Eat foods that are high in fiber. These include beans, whole grains, and fresh fruits and vegetables. ? Limit foods that are high in fat and sugar. These include fried or sweet foods. Activity  Exercise only as told by your doctor. Most people can do their usual exercise during pregnancy. Try to exercise for 30 minutes at least 5 days a week.  Stop exercising if you have pain or cramps in your belly or lower back.  Do not exercise if it is too hot or too humid, or if you are in a place of great height (high altitude).  Avoid heavy lifting.  If you choose to, you may have sex unless your doctor tells you not to. Relieving pain and discomfort  Wear a good support bra if your breasts are sore.  Take warm water baths (sitz baths) to soothe pain or discomfort caused by hemorrhoids. Use hemorrhoid cream if your doctor approves.  Rest with your legs raised (elevated) if you have leg cramps or low back pain.  If you develop bulging veins in your legs: ? Wear support hose as told by your doctor. ? Raise your feet for 15 minutes, 3-4 times a day. ? Limit salt in your food. Safety  Wear your seat belt at all times when you are in a car.  Talk with your doctor if someone is hurting you or yelling   or yelling at you a lot. Lifestyle  Do not use hot tubs, steam rooms, or saunas.  Do not douche. Do not use tampons or scented sanitary pads.  Avoid cat litter boxes and soil used by cats. These carry germs that can harm your baby and can cause a loss of your baby by miscarriage or stillbirth.  Do not use herbal medicines, illegal drugs, or medicines that are not approved by your doctor. Do not drink alcohol.  Do not smoke or use any products that contain nicotine or tobacco. If you need help quitting, ask your doctor. General instructions  Keep all follow-up visits. This is important.  Ask your doctor  about local prenatal classes.  Ask your doctor about the right foods to eat or for help finding a counselor. Where to find more information  American Pregnancy Association: americanpregnancy.org  [employer]: www.acog.org  Office on [employer] [facility]: MightyReward.co.nz Contact a doctor if:  You have a headache that does not go away when you take medicine.  You have changes in how you see, or you see spots in front of your eyes.  You have mild cramps, pressure, or pain in your lower belly.  You continue to feel like you may vomit (nauseous), you vomit, or you have watery poop (diarrhea).  You have bad-smelling fluid coming from your vagina.  You have pain when you pee or your pee smells bad.  You have very bad swelling of your face, hands, ankles, feet, or legs.  You have a fever. Get help right away if:  You are leaking fluid from your vagina.  You have spotting or bleeding from your vagina.  You have very bad belly cramping or pain.  You have trouble breathing.  You have chest pain.  You faint.  You have not felt your baby move for the time period told by your doctor.  You have new or increased pain, swelling, or redness in an arm or leg. Summary  The second trimester of pregnancy is from week 13 through week 27 (months 4 through 6).  Eat healthy meals.  Exercise as told by your doctor. Most people can do their usual exercise during pregnancy.  Do not use herbal medicines, illegal drugs, or medicines that are not approved by your doctor. Do not drink alcohol.  Call your doctor if you get sick or if you notice anything unusual about your pregnancy. This information is not intended to replace advice given to you by your health care provider. Make sure you discuss any questions you have with your health care provider. Document Revised: 10/26/2019 Document Reviewed: 09/01/2019 Elsevier Patient Education  2021  Elsevier Inc.   Terconazole vaginal cream What is this medicine? TERCONAZOLE (ter KON a zole) is an antifungal medicine. It is used to treat yeast infections of the vagina. This medicine may be used for other purposes; ask your health care provider or pharmacist if you have questions. COMMON BRAND NAME(S): Terazol 3, Terazol 7, Zazole What should I tell my health care provider before I take this medicine? They need to know if you have any of these conditions:  an unusual or allergic reaction to terconazole, other antifungals, other medicines, foods, dyes or preservatives  pregnant or trying to get pregnant  breast-feeding How should I use this medicine? This medicine is only for use in the vagina. Do not take by mouth. Wash hands before and after use. Read package directions carefully before using. Use this medicine at bedtime,  unless otherwise directed by your doctor or health care professional. Screw the applicator onto the end of the tube and squeeze the tube to fill the applicator. Remove the applicator from the tube. Lie on your back. Gently insert the applicator tip high in the vagina and push the plunger to release the cream into the vagina. Gently remove the applicator. Wash the applicator well with warm water and soap. Use at regular intervals. Do not get this medicine in your eyes. If you do, rinse out with plenty of cool tap water. Finish the full course prescribed by your doctor or health care professional even if you think your condition is better. Do not stop using this medicine if your menstrual period starts during the time of treatment. Talk to your pediatrician regarding the use of this medicine in children. Special care may be needed. Overdosage: If you think you have taken too much of this medicine contact a poison control center or emergency room at once. NOTE: This medicine is only for you. Do not share this medicine with others. What if I miss a dose? If you miss a dose,  use it as soon as you can. If it is almost time for your next dose, use only that dose. Do not use double or extra doses. What may interact with this medicine? Interactions are not expected. Do not use any other vaginal products without telling your doctor or health care professional. This list may not describe all possible interactions. Give your health care provider a list of all the medicines, herbs, non-prescription drugs, or dietary supplements you use. Also tell them if you smoke, drink alcohol, or use illegal drugs. Some items may interact with your medicine. What should I watch for while using this medicine? Tell your doctor or health care professional if your symptoms do not start to get better within a few days. It is better not to have sex until you have finished your treatment. If you have sex, your partner should use a condom during sex to help prevent transfer of the infection. Your sexual partner may also need treatment. Vaginal medicines usually will come out of the vagina during treatment. To keep the medicine from getting on your clothing, wear a mini-pad or sanitary napkin. The use of tampons is not recommended since they may soak up the medicine. To help clear up the infection, wear freshly washed cotton, not synthetic, underwear. What side effects may I notice from receiving this medicine? Side effects that you should report to your doctor or health care professional as soon as possible:  painful or difficult urination  vaginal pain Side effects that usually do not require medical attention (report to your doctor or health care professional if they continue or are bothersome):  headache  menstrual pain  stomach upset  vaginal irritation, itching or burning This list may not describe all possible side effects. Call your doctor for medical advice about side effects. You may report side effects to FDA at 1-800-FDA-1088. Where should I keep my medicine? Keep out of the reach  of children. Store at room temperature between 15 and 30 degrees C (59 and 86 degrees F). Throw away any unused medicine after the expiration date. NOTE: This sheet is a summary. It may not cover all possible information. If you have questions about this medicine, talk to your doctor, pharmacist, or health care provider.  2021 Elsevier/Gold Standard (2008-02-02 13:51:27)

## 2020-08-09 NOTE — Progress Notes (Signed)
I have seen, interviewed, and examined the patient in conjunction with the [employer] [facility] [employer] and affirm the diagnosis and management plan.   Gunnar Bulla, CNM [facility], [facility] 08/09/20 3:20 PM

## 2020-08-10 ENCOUNTER — Ambulatory Visit: Payer: Medicaid Other | Admitting: Internal Medicine

## 2020-08-10 NOTE — Progress Notes (Deleted)
Name: Cindy Brock  MRN/ DOB: [id number], 1996-03-11    Age/ Sex: 25 y.o., female    PCP: [facility]   Reason for Endocrinology Evaluation: Hyperthyroidism     Date of Initial Endocrinology Evaluation: 08/10/2020     HPI: Cindy Brock is a 25 y.o. female with unremarkable past medical history . The patient presented for initial endocrinology clinic visit on 08/10/2020 for consultative assistance with her Hyperthyroidism .      Pt with low TSh since 04/2019, (was pregnant at the time) with a nadir of 0.005 uIU/mL by 07/12/2020. She was also noted to have elevated FT4 at 3.18 ng/dL and FT3 at 9.2 pg/mL    She is currently at 17.2 weeks of gestation   HISTORY:  Past Medical History:  Past Medical History:  Diagnosis Date  . Asthma   . Hyperthyroidism   . Thyroid disease     Past Surgical History:  Past Surgical History:  Procedure Laterality Date  . NO PAST SURGERIES        Social History:  reports that she has never smoked. She has never used smokeless tobacco. She reports previous alcohol use. She reports previous drug use. Drug: Marijuana.  Family History: family history includes Hypothyroidism in her maternal grandmother; Rheum arthritis in her mother.   HOME MEDICATIONS: Allergies as of 08/10/2020   No Known Allergies     Medication List       Accurate as of August 10, 2020  7:06 AM. If you have any questions, ask your nurse or doctor.        PRENATAL 1 PO Take by mouth.   terconazole 0.4 % vaginal cream Commonly known as: TERAZOL 7 Place 1 applicator vaginally at bedtime.         REVIEW OF SYSTEMS: A comprehensive ROS was conducted with the patient and is negative except as per HPI and below:  ROS     OBJECTIVE:  VS: LMP 04/11/2020    Wt Readings from Last 3 Encounters:  08/09/20 151 lb 9.6 oz (68.8 kg)  07/12/20 148 lb (67.1 kg)  06/22/20 148 lb (67.1 kg)     EXAM: General: Pt appears well and is in  NAD  Hydration: Well-hydrated with moist mucous membranes and good skin turgor  Eyes: External eye exam normal without stare, lid lag or exophthalmos.  EOM intact.  PERRL.  Ears, Nose, Throat: Hearing: Grossly intact bilaterally Dental: Good dentition  Throat: Clear without mass, erythema or exudate  Neck: General: Supple without adenopathy. Thyroid: Thyroid size normal.  No goiter or nodules appreciated. No thyroid bruit.  Lungs: Clear with good BS bilat with no rales, rhonchi, or wheezes  Heart: Auscultation: RRR.  Abdomen: Normoactive bowel sounds, soft, nontender, without masses or organomegaly palpable  Extremities: Gait and station: Normal gait  Digits and nails: No clubbing, cyanosis, petechiae, or nodes Head and neck: Normal alignment and mobility BL UE: Normal ROM and strength. BL LE: No pretibial edema normal ROM and strength.  Skin: Hair: Texture and amount normal with gender appropriate distribution Skin Inspection: No rashes, acanthosis nigricans/skin tags. No lipohypertrophy Skin Palpation: Skin temperature, texture, and thickness normal to palpation  Neuro: Cranial nerves: II - XII grossly intact  Cerebellar: Normal coordination and movement; no tremor Motor: Normal strength throughout DTRs: 2+ and symmetric in UE without delay in relaxation phase  Mental Status: Judgment, insight: Intact Orientation: Oriented to time, place, and person Memory: Intact for recent and remote events  Mood and affect: No depression, anxiety, or agitation     DATA REVIEWED: ***    ASSESSMENT/PLAN/RECOMMENDATIONS:   1. Hyperthyroidism during pregnancy :    Medications :  Signed electronically by: Lyndle Herrlich, MD  [facility] Endocrinology  [facility] [address] [place], [place] [address] Phone: [phone] FAX: [phone]   CC: [facility] [address] [place] [address] Phone:  [phone] Fax: [phone]   Return to Endocrinology clinic as below: Future Appointments  Date Time Provider Department Center  08/10/2020 11:30 AM Shamleffer, Konrad Dolores, MD [facility] None  09/06/2020  1:45 PM Lawhorn, Vanessa , CNM [facility] None

## 2020-08-15 ENCOUNTER — Telehealth: Payer: Self-pay | Admitting: Internal Medicine

## 2020-08-15 NOTE — Telephone Encounter (Signed)
Noted  

## 2020-08-15 NOTE — Telephone Encounter (Signed)
FYI   Patient called to see if she could reschedule her appointment with Dr.Shamleffer. I let the patient know we could no longer see her due to 2 missed appointments and would have to get the okay by another Dr to see her. The other providers that were taking new pt's declined and we will be closing the referral.

## 2020-08-16 ENCOUNTER — Other Ambulatory Visit: Payer: Self-pay | Admitting: Certified Nurse Midwife

## 2020-08-16 DIAGNOSIS — Z3A18 18 weeks gestation of pregnancy: Secondary | ICD-10-CM

## 2020-08-16 DIAGNOSIS — O30042 Twin pregnancy, dichorionic/diamniotic, second trimester: Secondary | ICD-10-CM

## 2020-08-16 DIAGNOSIS — E0590 Thyrotoxicosis, unspecified without thyrotoxic crisis or storm: Secondary | ICD-10-CM

## 2020-08-16 MED ORDER — METHIMAZOLE 10 MG PO TABS: 10.0000 mg | ORAL_TABLET | Freq: Three times a day (TID) | ORAL | 0 refills | Status: DC

## 2020-08-16 NOTE — Progress Notes (Signed)
Rx Methimazole, see orders.    Serafina Royals, CNM [facility], [facility] 08/16/20 3:23 PM

## 2020-08-20 ENCOUNTER — Other Ambulatory Visit: Payer: Self-pay | Admitting: Certified Nurse Midwife

## 2020-08-20 ENCOUNTER — Telehealth: Payer: Self-pay | Admitting: Certified Nurse Midwife

## 2020-08-20 DIAGNOSIS — O99282 Endocrine, nutritional and metabolic diseases complicating pregnancy, second trimester: Secondary | ICD-10-CM

## 2020-08-20 DIAGNOSIS — E0590 Thyrotoxicosis, unspecified without thyrotoxic crisis or storm: Secondary | ICD-10-CM

## 2020-08-20 NOTE — Progress Notes (Signed)
Patient scheduled for appointment to discuss Tapazole dosing. Will recheck labs, see orders.    Serafina Royals, CNM [facility], [facility] 08/20/20 4:24 PM

## 2020-08-20 NOTE — Telephone Encounter (Signed)
May decrease to 5 mg PO daily. Schedule follow up appointment next week with me. Thanks, JML

## 2020-08-20 NOTE — Telephone Encounter (Signed)
Ula called in and states she thinks she is having a reaction to methimazole (Tapazole).  Addylin denies fever, breathing difficulties, and shortness of breath.  Makina states she has been throwing up more than usual, having chills, and diarrhea.  Shemika states she stopped taking the medication last night and has not taken her morning dose today.  Confirmed call back number is [phone].  Please advise.

## 2020-08-20 NOTE — Telephone Encounter (Signed)
Pt declines to take the medication until seen by JML. She wants an apt this week.   Appt made for 3/24. JML aware.

## 2020-08-20 NOTE — Telephone Encounter (Signed)
Pt aware. Current tablet is scored. Advised to cut in 1/2. Appt made for 3/28.

## 2020-08-20 NOTE — Telephone Encounter (Signed)
New message    Patient will not be taken medication until she gets a sooner appt to check her thyroid levels.   Please advise

## 2020-08-23 ENCOUNTER — Ambulatory Visit: Payer: Medicaid Other | Admitting: Internal Medicine

## 2020-08-23 ENCOUNTER — Other Ambulatory Visit: Payer: Self-pay

## 2020-08-23 ENCOUNTER — Ambulatory Visit (INDEPENDENT_AMBULATORY_CARE_PROVIDER_SITE_OTHER): Payer: Medicaid Other | Admitting: Certified Nurse Midwife

## 2020-08-23 ENCOUNTER — Encounter: Payer: Self-pay | Admitting: Certified Nurse Midwife

## 2020-08-23 VITALS — BP 118/76 | HR 102 | Wt 152.9 lb

## 2020-08-23 DIAGNOSIS — E0590 Thyrotoxicosis, unspecified without thyrotoxic crisis or storm: Secondary | ICD-10-CM

## 2020-08-23 DIAGNOSIS — Z3A19 19 weeks gestation of pregnancy: Secondary | ICD-10-CM

## 2020-08-23 DIAGNOSIS — O30042 Twin pregnancy, dichorionic/diamniotic, second trimester: Secondary | ICD-10-CM

## 2020-08-23 DIAGNOSIS — O99282 Endocrine, nutritional and metabolic diseases complicating pregnancy, second trimester: Secondary | ICD-10-CM

## 2020-08-23 LAB — POCT URINALYSIS DIPSTICK OB
Bilirubin, UA: NEGATIVE
Blood, UA: NEGATIVE
Glucose, UA: NEGATIVE
Ketones, UA: NEGATIVE
Leukocytes, UA: NEGATIVE
Nitrite, UA: NEGATIVE
POC,PROTEIN,UA: NEGATIVE
Spec Grav, UA: 1.015 (ref 1.010–1.025)
Urobilinogen, UA: 0.2 E.U./dL
pH, UA: 7 (ref 5.0–8.0)

## 2020-08-23 NOTE — Patient Instructions (Signed)
Round Ligament Pain  The round ligament is a cord of muscle and tissue that helps support the uterus. It can become a source of pain during pregnancy if it becomes stretched or twisted as the baby grows. The pain usually begins in the second trimester (13-28 weeks) of pregnancy, and it can come and go until the baby is delivered. It is not a serious problem, and it does not cause harm to the baby. Round ligament pain is usually a short, sharp, and pinching pain, but it can also be a dull, lingering, and aching pain. The pain is felt in the lower side of the abdomen or in the groin. It usually starts deep in the groin and moves up to the outside of the hip area. The pain may occur when you:  Suddenly change position, such as quickly going from a sitting to standing position.  Roll over in bed.  Cough or sneeze.  Do physical activity. Follow these instructions at home:  Watch your condition for any changes.  When the pain starts, relax. Then try any of these methods to help with the pain: ? Sitting down. ? Flexing your knees up to your abdomen. ? Lying on your side with one pillow under your abdomen and another pillow between your legs. ? Sitting in a warm bath for 15-20 minutes or until the pain goes away.  Take over-the-counter and prescription medicines only as told by your health care provider.  Move slowly when you sit down or stand up.  Avoid long walks if they cause pain.  Stop or reduce your physical activities if they cause pain.  Keep all follow-up visits as told by your health care provider. This is important.   Contact a health care provider if:  Your pain does not go away with treatment.  You feel pain in your back that you did not have before.  Your medicine is not helping. Get help right away if:  You have a fever or chills.  You develop uterine contractions.  You have vaginal bleeding.  You have nausea or vomiting.  You have diarrhea.  You have pain  when you urinate. Summary  Round ligament pain is felt in the lower abdomen or groin. It is usually a short, sharp, and pinching pain. It can also be a dull, lingering, and aching pain.  This pain usually begins in the second trimester (13-28 weeks). It occurs because the uterus is stretching with the growing baby, and it is not harmful to the baby.  You may notice the pain when you suddenly change position, when you cough or sneeze, or during physical activity.  Relaxing, flexing your knees to your abdomen, lying on one side, or taking a warm bath may help to get rid of the pain.  Get help from your health care provider if the pain does not go away or if you have vaginal bleeding, nausea, vomiting, diarrhea, or painful urination. This information is not intended to replace advice given to you by your health care provider. Make sure you discuss any questions you have with your health care provider. Document Revised: 11/04/2017 Document Reviewed: 11/04/2017 Elsevier Patient Education  2021 Elsevier Inc.     Second Trimester of Pregnancy  The second trimester of pregnancy is from week 13 through week 27. This is also called months 4 through 6 of pregnancy. This is often the time when you feel your best. During the second trimester:  Morning sickness is less or has stopped.  You   may have more energy.  You may feel hungry more often. At this time, your unborn baby (fetus) is growing very fast. At the end of the sixth month, the unborn baby may be up to 12 inches long and weigh about 1 pounds. You will likely start to feel the baby move between 16 and 20 weeks of pregnancy. Body changes during your second trimester Your body continues to go through many changes during this time. The changes vary and generally return to normal after the baby is born. Physical changes  You will gain more weight.  You may start to get stretch marks on your hips, belly (abdomen), and breasts.  Your  breasts will grow and may hurt.  Dark spots or blotches may develop on your face.  A dark line from your belly button to the pubic area (linea nigra) may appear.  You may have changes in your hair. Health changes  You may have headaches.  You may have heartburn.  You may have trouble pooping (constipation).  You may have hemorrhoids or swollen, bulging veins (varicose veins).  Your gums may bleed.  You may pee (urinate) more often.  You may have back pain. Follow these instructions at home: Medicines  Take over-the-counter and prescription medicines only as told by your doctor. Some medicines are not safe during pregnancy.  Take a prenatal vitamin that contains at least 600 micrograms (mcg) of folic acid. Eating and drinking  Eat healthy meals that include: ? Fresh fruits and vegetables. ? Whole grains. ? Good sources of protein, such as meat, eggs, or tofu. ? Low-fat dairy products.  Avoid raw meat and unpasteurized juice, milk, and cheese.  You may need to take these actions to prevent or treat trouble pooping: ? Drink enough fluids to keep your pee (urine) pale yellow. ? Eat foods that are high in fiber. These include beans, whole grains, and fresh fruits and vegetables. ? Limit foods that are high in fat and sugar. These include fried or sweet foods. Activity  Exercise only as told by your doctor. Most people can do their usual exercise during pregnancy. Try to exercise for 30 minutes at least 5 days a week.  Stop exercising if you have pain or cramps in your belly or lower back.  Do not exercise if it is too hot or too humid, or if you are in a place of great height (high altitude).  Avoid heavy lifting.  If you choose to, you may have sex unless your doctor tells you not to. Relieving pain and discomfort  Wear a good support bra if your breasts are sore.  Take warm water baths (sitz baths) to soothe pain or discomfort caused by hemorrhoids. Use  hemorrhoid cream if your doctor approves.  Rest with your legs raised (elevated) if you have leg cramps or low back pain.  If you develop bulging veins in your legs: ? Wear support hose as told by your doctor. ? Raise your feet for 15 minutes, 3-4 times a day. ? Limit salt in your food. Safety  Wear your seat belt at all times when you are in a car.  Talk with your doctor if someone is hurting you or yelling at you a lot. Lifestyle  Do not use hot tubs, steam rooms, or saunas.  Do not douche. Do not use tampons or scented sanitary pads.  Avoid cat litter boxes and soil used by cats. These carry germs that can harm your baby and can cause a loss   of your baby by miscarriage or stillbirth.  Do not use herbal medicines, illegal drugs, or medicines that are not approved by your doctor. Do not drink alcohol.  Do not smoke or use any products that contain nicotine or tobacco. If you need help quitting, ask your doctor. General instructions  Keep all follow-up visits. This is important.  Ask your doctor about local prenatal classes.  Ask your doctor about the right foods to eat or for help finding a counselor. Where to find more information  American Pregnancy Association: americanpregnancy.org  American College of Obstetricians and Gynecologists: www.acog.org  Office on Women's Health: womenshealth.gov/pregnancy Contact a doctor if:  You have a headache that does not go away when you take medicine.  You have changes in how you see, or you see spots in front of your eyes.  You have mild cramps, pressure, or pain in your lower belly.  You continue to feel like you may vomit (nauseous), you vomit, or you have watery poop (diarrhea).  You have bad-smelling fluid coming from your vagina.  You have pain when you pee or your pee smells bad.  You have very bad swelling of your face, hands, ankles, feet, or legs.  You have a fever. Get help right away if:  You are leaking  fluid from your vagina.  You have spotting or bleeding from your vagina.  You have very bad belly cramping or pain.  You have trouble breathing.  You have chest pain.  You faint.  You have not felt your baby move for the time period told by your doctor.  You have new or increased pain, swelling, or redness in an arm or leg. Summary  The second trimester of pregnancy is from week 13 through week 27 (months 4 through 6).  Eat healthy meals.  Exercise as told by your doctor. Most people can do their usual exercise during pregnancy.  Do not use herbal medicines, illegal drugs, or medicines that are not approved by your doctor. Do not drink alcohol.  Call your doctor if you get sick or if you notice anything unusual about your pregnancy. This information is not intended to replace advice given to you by your health care provider. Make sure you discuss any questions you have with your health care provider. Document Revised: 10/26/2019 Document Reviewed: 09/01/2019 Elsevier Patient Education  2021 Elsevier Inc.  

## 2020-08-23 NOTE — Progress Notes (Signed)
I have seen, interviewed, and examined the patient in conjunction with the [employer] and affirm the diagnosis and management plan.   Gunnar Bulla, CNM [facility], [facility] 08/23/20 4:35 PM

## 2020-08-23 NOTE — Progress Notes (Addendum)
ROB doing well overall, goiter visible on exam. Reports stopped taking Methimazole Tuesday and feels better. Labs today, see orders. Ultrasound to visualize both heart rates, twins vertex side by side with positive movement x 2. Anticipatory guidance regarding course of prenatal care. Reviewed red flag symptoms and when to call. RTC x 2 weeks for ROB with JML or sooner if needed.  Juliann Pares, Student-MidWife [employer] 08/23/20 4:00 PM

## 2020-08-23 NOTE — Progress Notes (Signed)
ROB doing well. Only concern is her thyroid.

## 2020-08-24 LAB — THYROID PEROXIDASE ANTIBODY: Thyroperoxidase Ab SerPl-aCnc: <8 IU/mL (ref 0–34)

## 2020-08-24 LAB — TSH+T4F+T3FREE
Free T4: 1.74 ng/dL (ref 0.82–1.77)
T3, Free: 4.1 pg/mL (ref 2.0–4.4)
TSH: <0.005 u[IU]/mL — ABNORMAL LOW (ref 0.450–4.500)

## 2020-08-27 ENCOUNTER — Encounter: Payer: Medicaid Other | Admitting: Certified Nurse Midwife

## 2020-09-06 ENCOUNTER — Encounter: Payer: Medicaid Other | Admitting: Certified Nurse Midwife

## 2020-09-06 ENCOUNTER — Telehealth: Payer: Self-pay | Admitting: Certified Nurse Midwife

## 2020-09-06 DIAGNOSIS — O30042 Twin pregnancy, dichorionic/diamniotic, second trimester: Secondary | ICD-10-CM

## 2020-09-06 DIAGNOSIS — Z3402 Encounter for supervision of normal first pregnancy, second trimester: Secondary | ICD-10-CM

## 2020-09-06 DIAGNOSIS — Z3A21 21 weeks gestation of pregnancy: Secondary | ICD-10-CM

## 2020-09-06 NOTE — Telephone Encounter (Signed)
HIPPA approved messaged left on identified line.    Cindy Brock, CNM [facility], [facility] 09/06/20 2:06 PM

## 2020-09-10 ENCOUNTER — Encounter: Payer: Medicaid Other | Admitting: Certified Nurse Midwife

## 2020-09-10 ENCOUNTER — Telehealth: Payer: Self-pay | Admitting: Certified Nurse Midwife

## 2020-09-10 DIAGNOSIS — Z3A21 21 weeks gestation of pregnancy: Secondary | ICD-10-CM

## 2020-09-10 DIAGNOSIS — O30042 Twin pregnancy, dichorionic/diamniotic, second trimester: Secondary | ICD-10-CM

## 2020-09-10 NOTE — Telephone Encounter (Signed)
HIPPA approved messaged left on identified line asking patient to call office.    Serafina Royals, CNM [facility], [facility] 09/10/20 3:39 PM

## 2020-09-11 ENCOUNTER — Other Ambulatory Visit: Payer: Self-pay | Admitting: Certified Nurse Midwife

## 2020-09-11 ENCOUNTER — Other Ambulatory Visit: Payer: Self-pay

## 2020-09-11 ENCOUNTER — Ambulatory Visit: Payer: Medicaid Other | Attending: Obstetrics

## 2020-09-11 DIAGNOSIS — Z3A17 17 weeks gestation of pregnancy: Secondary | ICD-10-CM

## 2020-09-11 DIAGNOSIS — O99282 Endocrine, nutritional and metabolic diseases complicating pregnancy, second trimester: Secondary | ICD-10-CM | POA: Insufficient documentation

## 2020-09-11 DIAGNOSIS — Z3689 Encounter for other specified antenatal screening: Secondary | ICD-10-CM | POA: Diagnosis not present

## 2020-09-11 DIAGNOSIS — E0590 Thyrotoxicosis, unspecified without thyrotoxic crisis or storm: Secondary | ICD-10-CM

## 2020-09-11 DIAGNOSIS — O30042 Twin pregnancy, dichorionic/diamniotic, second trimester: Secondary | ICD-10-CM

## 2020-09-11 DIAGNOSIS — Z3A21 21 weeks gestation of pregnancy: Secondary | ICD-10-CM | POA: Diagnosis not present

## 2020-09-12 ENCOUNTER — Encounter: Payer: Self-pay | Admitting: Certified Nurse Midwife

## 2020-09-13 ENCOUNTER — Other Ambulatory Visit: Payer: Self-pay | Admitting: Certified Nurse Midwife

## 2020-09-13 DIAGNOSIS — Z3A17 17 weeks gestation of pregnancy: Secondary | ICD-10-CM

## 2020-09-13 DIAGNOSIS — Z3689 Encounter for other specified antenatal screening: Secondary | ICD-10-CM

## 2020-09-13 DIAGNOSIS — O30042 Twin pregnancy, dichorionic/diamniotic, second trimester: Secondary | ICD-10-CM

## 2020-09-13 DIAGNOSIS — E0590 Thyrotoxicosis, unspecified without thyrotoxic crisis or storm: Secondary | ICD-10-CM

## 2020-09-17 ENCOUNTER — Ambulatory Visit: Payer: Medicaid Other

## 2020-09-24 ENCOUNTER — Ambulatory Visit (INDEPENDENT_AMBULATORY_CARE_PROVIDER_SITE_OTHER): Payer: Medicaid Other | Admitting: Certified Nurse Midwife

## 2020-09-24 ENCOUNTER — Encounter: Payer: Self-pay | Admitting: Certified Nurse Midwife

## 2020-09-24 ENCOUNTER — Other Ambulatory Visit: Payer: Self-pay

## 2020-09-24 VITALS — BP 126/89 | HR 99 | Wt 163.0 lb

## 2020-09-24 DIAGNOSIS — Z3402 Encounter for supervision of normal first pregnancy, second trimester: Secondary | ICD-10-CM

## 2020-09-24 DIAGNOSIS — O26892 Other specified pregnancy related conditions, second trimester: Secondary | ICD-10-CM

## 2020-09-24 DIAGNOSIS — O30042 Twin pregnancy, dichorionic/diamniotic, second trimester: Secondary | ICD-10-CM

## 2020-09-24 DIAGNOSIS — Z3A23 23 weeks gestation of pregnancy: Secondary | ICD-10-CM

## 2020-09-24 DIAGNOSIS — M25559 Pain in unspecified hip: Secondary | ICD-10-CM

## 2020-09-24 LAB — POCT URINALYSIS DIPSTICK OB
Bilirubin, UA: NEGATIVE
Blood, UA: NEGATIVE
Glucose, UA: NEGATIVE
Ketones, UA: NEGATIVE
Leukocytes, UA: NEGATIVE
Nitrite, UA: NEGATIVE
Spec Grav, UA: 1.015 (ref 1.010–1.025)
Urobilinogen, UA: 0.2 E.U./dL
pH, UA: 7 (ref 5.0–8.0)

## 2020-09-24 NOTE — Progress Notes (Signed)
ROB-Reports left hip pain, questions possibility of prolapse after this birth. Discussed home treatment measures and referral to Pelvic Floor Physical Therapy, see orders. Follow up appointment scheduled with MFM on 10/09/2020, see chart. Anticipatory guidance regarding recommended antenatal testing (NST/BPP-32 weeks and monthly growth ultrasound) course of prenatal care. Reviewed red flag symptoms and when to call. RTC x 4 weeks for 28 weeks labs and ROB with JML or sooner if needed.

## 2020-09-24 NOTE — Progress Notes (Signed)
ROB: She is doing well, no new concerns today. ?

## 2020-09-24 NOTE — Patient Instructions (Signed)
National Guideline Alliance ([place]).Twin and Triplet Pregnancy. [place]: National Institute for Health and Care Excellence ([place]); 2019.">  Multiple Pregnancy Multiple pregnancy means that a woman is carrying more than one baby at a time. She may be pregnant with twins, triplets, or more. The majority of multiple pregnancies are twins. Naturally conceiving triplets or more (higher-order multiples) is rare. Multiple pregnancies are riskier than single pregnancies. A woman with a multiple pregnancy is more likely to have certain problems during her pregnancy. How does a multiple pregnancy happen? A multiple pregnancy happens when:  The woman's body releases more than one egg at a time, and then each egg gets fertilized by a different sperm. ? This is the most common type of multiple pregnancy. ? Twins or other multiples produced this way are called fraternal. They are no more alike than non-multiple siblings are.  One sperm fertilizes one egg, which then divides into more than one embryo. ? Twins or other multiples produced this way are called identical. Identical multiples are always the same gender, and they look very much alike.   Who is most likely to have a multiple pregnancy? A multiple pregnancy is more likely to develop in women who:  Have had fertility treatment, especially if the treatment included fertility medicines.  Are older than 25 years of age.  Have already had four or more children.  Have a family history of multiple pregnancy. How is a multiple pregnancy diagnosed? A multiple pregnancy may be diagnosed based on:  Symptoms such as: ? Rapid weight gain in the first 3 months of pregnancy (first trimester). ? More severe nausea and breast tenderness than what is typical of a single pregnancy. ? A larger uterus than what is normal for the stage of the pregnancy.  Blood tests that detect a higher-than-normal level of human chorionic gonadotropin (hCG). This is a hormone that your  body produces in early pregnancy.  An ultrasound exam. This is used to confirm that you are carrying multiples. What risks come with multiple pregnancy? A multiple pregnancy puts you at a higher risk for certain problems during or after your pregnancy. These include:  Delivering your babies before your due date (preterm birth). A full-term pregnancy lasts for at least 37 weeks. ? Babies born before 37 weeks may have a higher risk for breathing problems, feeding difficulties, cerebral palsy, and learning disabilities.  Diabetes.  Preeclampsia. This is a serious condition that causes high blood pressure and headaches during pregnancy.  Too much blood loss after childbirth (postpartum hemorrhage).  Postpartum depression.  Low birth weight of the babies. How will having a multiple pregnancy affect my care? Your health care team will monitor you more closely. You may need more frequent prenatal visits. This will ensure that you are healthy and that your babies are growing normally. Follow these instructions at home: Eating and drinking  Increase your nutrition. ? Follow your health care provider's recommendations for weight gain. You may need to gain a little extra weight when you are pregnant with multiples. ? Eat healthy snacks often throughout the day. This will add calories and reduce nausea.  Drink enough fluid to keep your urine pale yellow.  Take prenatal vitamins. Ask your health care provider what vitamins are right for you. Activity Limit your activities by 20-24 weeks of pregnancy.  Rest often.  Avoid activities, exercise, and work that take a lot of effort.  Ask your health care provider when you should stop having sex. General instructions  Do not   use any products that contain nicotine or tobacco, such as cigarettes, e-cigarettes, and chewing tobacco. If you need help quitting, ask your health care provider.  Do not drink alcohol or use illegal drugs.  Take  over-the-counter and prescription medicines only as told by your health care provider.  Arrange for extra help around the house.  Keep all follow-up visits and all prenatal visits as told by your health care provider. This is important. Where to find more information  [employer] of [employer]stetric[employer] and Gynecology: www.acog.org Contact a health care provider if:  You have dizziness.  You have nausea, vomiting, or diarrhea that does not go away.  You have depression or other emotions that are interfering with your normal activities.  You have a fever.  You have pain with urination.  You have a bad-smelling vaginal discharge.  You notice increased swelling in your face, hands, legs, or ankles. Get help right away if:  You have fluid leaking from your vagina.  You have bleeding from your vagina.  You have pelvic cramps, pelvic pressure, or nagging pain in your abdomen or lower back.  You are having regular contractions.  You have a severe headache, with or without changes in how you see.  You have chest pain or shortness of breath.  You notice that your babies move less often, or do not move at all. Summary  Having a multiple pregnancy means that a woman is carrying more than one baby at a time.  A multiple pregnancy puts you at a higher risk for delivering your babies before your due date, having diabetes, preeclampsia, too much blood loss after childbirth, or low birth weight of the babies.  Your health care provider will monitor you more closely during your pregnancy.  You may need to make some lifestyle changes during pregnancy. This includes eating more, limiting your activities after 20-24 weeks of pregnancy, and arranging for extra help around the house.  Follow up with your health care provider as instructed if you experience any complications. This information is not intended to replace advice given to you by your health care provider. Make sure you discuss  any questions you have with your health care provider. Document Revised: 01/10/2019 Document Reviewed: 01/10/2019 Elsevier Patient Education  2021 Elsevier Inc.   Second Trimester of Pregnancy  The second trimester of pregnancy is from week 13 through week 27. This is also called months 4 through 6 of pregnancy. This is often the time when you feel your best. During the second trimester:  Morning sickness is less or has stopped.  You may have more energy.  You may feel hungry more often. At this time, your unborn baby (fetus) is growing very fast. At the end of the sixth month, the unborn baby may be up to 12 inches long and weigh about 1 pounds. You will likely start to feel the baby move between 16 and 20 weeks of pregnancy. Body changes during your second trimester Your body continues to go through many changes during this time. The changes vary and generally return to normal after the baby is born. Physical changes  You will gain more weight.  You may start to get stretch marks on your hips, belly (abdomen), and breasts.  Your breasts will grow and may hurt.  Dark spots or blotches may develop on your face.  A dark line from your belly button to the pubic area (linea nigra) may appear.  You may have changes in your hair. Health changes  You may have headaches.  You may have heartburn.  You may have trouble pooping (constipation).  You may have hemorrhoids or swollen, bulging veins (varicose veins).  Your gums may bleed.  You may pee (urinate) more often.  You may have back pain. Follow these instructions at home: Medicines  Take over-the-counter and prescription medicines only as told by your doctor. Some medicines are not safe during pregnancy.  Take a prenatal vitamin that contains at least 600 micrograms (mcg) of folic acid. Eating and drinking  Eat healthy meals that include: ? Fresh fruits and vegetables. ? Whole grains. ? Good sources of protein,  such as meat, eggs, or tofu. ? Low-fat dairy products.  Avoid raw meat and unpasteurized juice, milk, and cheese.  You may need to take these actions to prevent or treat trouble pooping: ? Drink enough fluids to keep your pee (urine) pale yellow. ? Eat foods that are high in fiber. These include beans, whole grains, and fresh fruits and vegetables. ? Limit foods that are high in fat and sugar. These include fried or sweet foods. Activity  Exercise only as told by your doctor. Most people can do their usual exercise during pregnancy. Try to exercise for 30 minutes at least 5 days a week.  Stop exercising if you have pain or cramps in your belly or lower back.  Do not exercise if it is too hot or too humid, or if you are in a place of great height (high altitude).  Avoid heavy lifting.  If you choose to, you may have sex unless your doctor tells you not to. Relieving pain and discomfort  Wear a good support bra if your breasts are sore.  Take warm water baths (sitz baths) to soothe pain or discomfort caused by hemorrhoids. Use hemorrhoid cream if your doctor approves.  Rest with your legs raised (elevated) if you have leg cramps or low back pain.  If you develop bulging veins in your legs: ? Wear support hose as told by your doctor. ? Raise your feet for 15 minutes, 3-4 times a day. ? Limit salt in your food. Safety  Wear your seat belt at all times when you are in a car.  Talk with your doctor if someone is hurting you or yelling at you a lot. Lifestyle  Do not use hot tubs, steam rooms, or saunas.  Do not douche. Do not use tampons or scented sanitary pads.  Avoid cat litter boxes and soil used by cats. These carry germs that can harm your baby and can cause a loss of your baby by miscarriage or stillbirth.  Do not use herbal medicines, illegal drugs, or medicines that are not approved by your doctor. Do not drink alcohol.  Do not smoke or use any products that contain  nicotine or tobacco. If you need help quitting, ask your doctor. General instructions  Keep all follow-up visits. This is important.  Ask your doctor about local prenatal classes.  Ask your doctor about the right foods to eat or for help finding a counselor. Where to find more information  American Pregnancy Association: americanpregnancy.org  [employer] of Obstetricians and Gynecologists: www.acog.org  Office on [employer] Health: [employer].nz Contact a doctor if:  You have a headache that does not go away when you take medicine.  You have changes in how you see, or you see spots in front of your eyes.  You have mild cramps, pressure, or pain in your lower belly.  You continue to  feel like you may vomit (nauseous), you vomit, or you have watery poop (diarrhea).  You have bad-smelling fluid coming from your vagina.  You have pain when you pee or your pee smells bad.  You have very bad swelling of your face, hands, ankles, feet, or legs.  You have a fever. Get help right away if:  You are leaking fluid from your vagina.  You have spotting or bleeding from your vagina.  You have very bad belly cramping or pain.  You have trouble breathing.  You have chest pain.  You faint.  You have not felt your baby move for the time period told by your doctor.  You have new or increased pain, swelling, or redness in an arm or leg. Summary  The second trimester of pregnancy is from week 13 through week 27 (months 4 through 6).  Eat healthy meals.  Exercise as told by your doctor. Most people can do their usual exercise during pregnancy.  Do not use herbal medicines, illegal drugs, or medicines that are not approved by your doctor. Do not drink alcohol.  Call your doctor if you get sick or if you notice anything unusual about your pregnancy. This information is not intended to replace advice given to you by your health care provider. Make sure you discuss  any questions you have with your health care provider. Document Revised: 10/26/2019 Document Reviewed: 09/01/2019 Elsevier Patient Education  2021 [employer].

## 2020-10-04 ENCOUNTER — Ambulatory Visit: Payer: Medicaid Other

## 2020-10-08 ENCOUNTER — Ambulatory Visit: Payer: Medicaid Other

## 2020-10-08 ENCOUNTER — Other Ambulatory Visit: Payer: Self-pay | Admitting: Certified Nurse Midwife

## 2020-10-08 DIAGNOSIS — O30042 Twin pregnancy, dichorionic/diamniotic, second trimester: Secondary | ICD-10-CM

## 2020-10-09 ENCOUNTER — Other Ambulatory Visit: Payer: Medicaid Other

## 2020-10-09 ENCOUNTER — Telehealth: Payer: Self-pay | Admitting: Certified Nurse Midwife

## 2020-10-09 NOTE — Progress Notes (Deleted)
Pt had an appt with Maternal Fetal Care @ [facility] today.  Pt called at appt time to cancel due to childcare issues.  Pt understood that next available appt open for twins in our office would not be until June 7th.  She said she would call [facility] to check their schedule.

## 2020-10-09 NOTE — Telephone Encounter (Signed)
Called patient to inform her that I was sending work note for today and to follow up if issues continued. She said pain is intermittent and she has been drinking plenty of water. She verbalized understanding needing to follow up.

## 2020-10-09 NOTE — Telephone Encounter (Signed)
New Message:  Pt called requesting a work note for today.  She was unable to go in due to having braxton hicks contractions.

## 2020-10-10 ENCOUNTER — Encounter: Payer: Self-pay | Admitting: Obstetrics and Gynecology

## 2020-10-10 ENCOUNTER — Ambulatory Visit (INDEPENDENT_AMBULATORY_CARE_PROVIDER_SITE_OTHER): Payer: Medicaid Other | Admitting: Obstetrics and Gynecology

## 2020-10-10 ENCOUNTER — Other Ambulatory Visit: Payer: Self-pay

## 2020-10-10 DIAGNOSIS — O479 False labor, unspecified: Secondary | ICD-10-CM

## 2020-10-10 DIAGNOSIS — Z3A26 26 weeks gestation of pregnancy: Secondary | ICD-10-CM

## 2020-10-10 DIAGNOSIS — O30042 Twin pregnancy, dichorionic/diamniotic, second trimester: Secondary | ICD-10-CM

## 2020-10-10 DIAGNOSIS — O34529 Maternal care for prolapse of gravid uterus, unspecified trimester: Secondary | ICD-10-CM

## 2020-10-10 LAB — POCT URINALYSIS DIPSTICK OB
Bilirubin, UA: NEGATIVE
Blood, UA: NEGATIVE
Glucose, UA: NEGATIVE
Ketones, UA: NEGATIVE
Leukocytes, UA: NEGATIVE
Nitrite, UA: NEGATIVE
POC,PROTEIN,UA: NEGATIVE
Spec Grav, UA: 1.01 (ref 1.010–1.025)
Urobilinogen, UA: 0.2 E.U./dL
pH, UA: 8 (ref 5.0–8.0)

## 2020-10-10 NOTE — Progress Notes (Signed)
Problem OB: Patient reports increase in pelvic pressure, was also having some back pain and possible contractions but had been moving around more so drank water and rested and noted symptoms improved.  She also reports complaints of something protruding from her vagina, thinks she has a prolapse. Took a picture on her phone of vaginal region which confirms rectocele and possible cervical prolapse (did not desire exam today). Has plans to begin pelvic floor physical therapy soon which may help her prolapse.  Discussed possibleuse of pessary in pregnancy if prolapse becomes significant.  RTC in 2 weeks for next visit as scheduled with Marcelino Duster.

## 2020-10-10 NOTE — Patient Instructions (Signed)

## 2020-10-10 NOTE — Progress Notes (Signed)
OB pt present due to having possible prolapse vaginal, back pain and contractions. Pt stated that she had a vaginal prolapse after the birth of her last child about a year ago. Pt stated that she felt something touching her leg and when she took of picture to see what it was she could see something coming out of the vaginal area.

## 2020-10-23 ENCOUNTER — Ambulatory Visit: Payer: Medicaid Other

## 2020-10-25 ENCOUNTER — Ambulatory Visit (INDEPENDENT_AMBULATORY_CARE_PROVIDER_SITE_OTHER): Payer: Medicaid Other | Admitting: Certified Nurse Midwife

## 2020-10-25 ENCOUNTER — Other Ambulatory Visit (HOSPITAL_COMMUNITY)
Admission: RE | Admit: 2020-10-25 | Discharge: 2020-10-25 | Disposition: A | Discharge status: Home / Self Care | Payer: Medicaid Other | Source: Ambulatory Visit | Attending: Certified Nurse Midwife | Admitting: Certified Nurse Midwife

## 2020-10-25 ENCOUNTER — Other Ambulatory Visit: Payer: Self-pay

## 2020-10-25 ENCOUNTER — Other Ambulatory Visit: Payer: Medicaid Other

## 2020-10-25 ENCOUNTER — Encounter: Payer: Self-pay | Admitting: Certified Nurse Midwife

## 2020-10-25 VITALS — BP 110/76 | HR 111 | Wt 164.0 lb

## 2020-10-25 DIAGNOSIS — E0590 Thyrotoxicosis, unspecified without thyrotoxic crisis or storm: Secondary | ICD-10-CM | POA: Diagnosis not present

## 2020-10-25 DIAGNOSIS — Z3A28 28 weeks gestation of pregnancy: Secondary | ICD-10-CM | POA: Diagnosis not present

## 2020-10-25 DIAGNOSIS — Z3403 Encounter for supervision of normal first pregnancy, third trimester: Secondary | ICD-10-CM

## 2020-10-25 DIAGNOSIS — N760 Acute vaginitis: Secondary | ICD-10-CM | POA: Insufficient documentation

## 2020-10-25 DIAGNOSIS — O30043 Twin pregnancy, dichorionic/diamniotic, third trimester: Secondary | ICD-10-CM | POA: Diagnosis not present

## 2020-10-25 DIAGNOSIS — Z23 Encounter for immunization: Secondary | ICD-10-CM

## 2020-10-25 DIAGNOSIS — O99282 Endocrine, nutritional and metabolic diseases complicating pregnancy, second trimester: Secondary | ICD-10-CM | POA: Diagnosis not present

## 2020-10-25 NOTE — Patient Instructions (Signed)
Fetal Movement Counts Patient Name: ________________________________________________ Patient Due Date: ____________________  What is a fetal movement count? A fetal movement count is the number of times that you feel your baby move during a certain amount of time. This may also be called a fetal kick count. A fetal movement count is recommended for every pregnant woman. You may be asked to start counting fetal movements as early as week 28 of your pregnancy. Pay attention to when your baby is most active. You may notice your baby's sleep and wake cycles. You may also notice things that make your baby move more. You should do a fetal movement count:  When your baby is normally most active.  At the same time each day. A good time to count movements is while you are resting, after having something to eat and drink. How do I count fetal movements? 1. Find a quiet, comfortable area. Sit, or lie down on your side. 2. Write down the date, the start time and stop time, and the number of movements that you felt between those two times. Take this information with you to your health care visits. 3. Write down your start time when you feel the first movement. 4. Count kicks, flutters, swishes, rolls, and jabs. You should feel at least 10 movements. 5. You may stop counting after you have felt 10 movements, or if you have been counting for 2 hours. Write down the stop time. 6. If you do not feel 10 movements in 2 hours, contact your health care provider for further instructions. Your health care provider may want to do additional tests to assess your baby's well-being. Contact a health care provider if:  You feel fewer than 10 movements in 2 hours.  Your baby is not moving like he or she usually does. Date: ____________ Start time: ____________ Stop time: ____________ Movements: ____________ Date: ____________ Start time: ____________ Stop time: ____________ Movements: ____________ Date: ____________  Start time: ____________ Stop time: ____________ Movements: ____________ Date: ____________ Start time: ____________ Stop time: ____________ Movements: ____________ Date: ____________ Start time: ____________ Stop time: ____________ Movements: ____________ Date: ____________ Start time: ____________ Stop time: ____________ Movements: ____________ Date: ____________ Start time: ____________ Stop time: ____________ Movements: ____________ Date: ____________ Start time: ____________ Stop time: ____________ Movements: ____________ Date: ____________ Start time: ____________ Stop time: ____________ Movements: ____________ This information is not intended to replace advice given to you by your health care provider. Make sure you discuss any questions you have with your health care provider. Document Revised: 01/06/2019 Document Reviewed: 01/06/2019 Elsevier Patient Education  2021 [employer].   [employer] ([place]).Twin and Triplet Pregnancy. [place]: [employer] and [employer] ([place]); 2019.">  Multiple Pregnancy Multiple pregnancy means that a woman is carrying more than one baby at a time. She may be pregnant with twins, triplets, or more. The majority of multiple pregnancies are twins. Naturally conceiving triplets or more (higher-order multiples) is rare. Multiple pregnancies are riskier than single pregnancies. A woman with a multiple pregnancy is more likely to have certain problems during her pregnancy. How does a multiple pregnancy happen? A multiple pregnancy happens when:  The woman's body releases more than one egg at a time, and then each egg gets fertilized by a different sperm. ? This is the most common type of multiple pregnancy. ? Twins or other multiples produced this way are called fraternal. They are no more alike than non-multiple siblings are.  One sperm fertilizes one egg, which then divides into  more than one embryo. ? Twins or other  multiples produced this way are called identical. Identical multiples are always the same gender, and they look very much alike.   Who is most likely to have a multiple pregnancy? A multiple pregnancy is more likely to develop in women who:  Have had fertility treatment, especially if the treatment included fertility medicines.  Are older than 25 years of age.  Have already had four or more children.  Have a family history of multiple pregnancy. How is a multiple pregnancy diagnosed? A multiple pregnancy may be diagnosed based on:  Symptoms such as: ? Rapid weight gain in the first 3 months of pregnancy (first trimester). ? More severe nausea and breast tenderness than what is typical of a single pregnancy. ? A larger uterus than what is normal for the stage of the pregnancy.  Blood tests that detect a higher-than-normal level of human chorionic gonadotropin (hCG). This is a hormone that your body produces in early pregnancy.  An ultrasound exam. This is used to confirm that you are carrying multiples. What risks come with multiple pregnancy? A multiple pregnancy puts you at a higher risk for certain problems during or after your pregnancy. These include:  Delivering your babies before your due date (preterm birth). A full-term pregnancy lasts for at least 37 weeks. ? Babies born before 37 weeks may have a higher risk for breathing problems, feeding difficulties, cerebral palsy, and learning disabilities.  Diabetes.  Preeclampsia. This is a serious condition that causes high blood pressure and headaches during pregnancy.  Too much blood loss after childbirth (postpartum hemorrhage).  Postpartum depression.  Low birth weight of the babies. How will having a multiple pregnancy affect my care? Your health care team will monitor you more closely. You may need more frequent prenatal visits. This will ensure that you are healthy and that your babies are growing normally. Follow these  instructions at home: Eating and drinking  Increase your nutrition. ? Follow your health care provider's recommendations for weight gain. You may need to gain a little extra weight when you are pregnant with multiples. ? Eat healthy snacks often throughout the day. This will add calories and reduce nausea.  Drink enough fluid to keep your urine pale yellow.  Take prenatal vitamins. Ask your health care provider what vitamins are right for you. Activity Limit your activities by 20-24 weeks of pregnancy.  Rest often.  Avoid activities, exercise, and work that take a lot of effort.  Ask your health care provider when you should stop having sex. General instructions  Do not use any products that contain nicotine or tobacco, such as cigarettes, e-cigarettes, and chewing tobacco. If you need help quitting, ask your health care provider.  Do not drink alcohol or use illegal drugs.  Take over-the-counter and prescription medicines only as told by your health care provider.  Arrange for extra help around the house.  Keep all follow-up visits and all prenatal visits as told by your health care provider. This is important. Where to find more information  [employer]: www.acog.org Contact a health care provider if:  You have dizziness.  You have nausea, vomiting, or diarrhea that does not go away.  You have depression or other emotions that are interfering with your normal activities.  You have a fever.  You have pain with urination.  You have a bad-smelling vaginal discharge.  You notice increased swelling in your face, hands, legs, or ankles. Get  help right away if:  You have fluid leaking from your vagina.  You have bleeding from your vagina.  You have pelvic cramps, pelvic pressure, or nagging pain in your abdomen or lower back.  You are having regular contractions.  You have a severe headache, with or without changes in how you  see.  You have chest pain or shortness of breath.  You notice that your babies move less often, or do not move at all. Summary  Having a multiple pregnancy means that a woman is carrying more than one baby at a time.  A multiple pregnancy puts you at a higher risk for delivering your babies before your due date, having diabetes, preeclampsia, too much blood loss after childbirth, or low birth weight of the babies.  Your health care provider will monitor you more closely during your pregnancy.  You may need to make some lifestyle changes during pregnancy. This includes eating more, limiting your activities after 20-24 weeks of pregnancy, and arranging for extra help around the house.  Follow up with your health care provider as instructed if you experience any complications. This information is not intended to replace advice given to you by your health care provider. Make sure you discuss any questions you have with your health care provider. Document Revised: 01/10/2019 Document Reviewed: 01/10/2019 Elsevier Patient Education  2021 [employer].

## 2020-10-25 NOTE — Progress Notes (Signed)
ROB: She is having lots of Braxton Hick's today.

## 2020-10-26 ENCOUNTER — Encounter: Payer: Self-pay | Admitting: Certified Nurse Midwife

## 2020-10-26 DIAGNOSIS — O99019 Anemia complicating pregnancy, unspecified trimester: Secondary | ICD-10-CM | POA: Insufficient documentation

## 2020-10-26 LAB — CERVICOVAGINAL ANCILLARY ONLY
Bacterial Vaginitis (gardnerella): NEGATIVE
Candida Glabrata: NEGATIVE
Candida Vaginitis: POSITIVE — AB
Comment: NEGATIVE
Comment: NEGATIVE
Comment: NEGATIVE

## 2020-10-26 LAB — CBC
Hematocrit: 30.6 % — ABNORMAL LOW (ref 34.0–46.6)
Hemoglobin: 9.6 g/dL — ABNORMAL LOW (ref 11.1–15.9)
MCH: 23.3 pg — ABNORMAL LOW (ref 26.6–33.0)
MCHC: 31.4 g/dL — ABNORMAL LOW (ref 31.5–35.7)
MCV: 74 fL — ABNORMAL LOW (ref 79–97)
Platelets: 298 10*3/uL (ref 150–450)
RBC: 4.12 x10E6/uL (ref 3.77–5.28)
RDW: 14.4 % (ref 11.7–15.4)
WBC: 9.7 10*3/uL (ref 3.4–10.8)

## 2020-10-26 LAB — RPR: RPR Ser Ql: NONREACTIVE

## 2020-10-26 LAB — GLUCOSE, 1 HOUR GESTATIONAL: Gestational Diabetes Screen: 77 mg/dL (ref 65–139)

## 2020-10-26 MED ORDER — TERCONAZOLE 0.4 % VA CREA: 1.0000 | TOPICAL_CREAM | Freq: Every day | VAGINAL | 0 refills | Status: DC

## 2020-10-26 NOTE — Progress Notes (Signed)
ROB-Reports increased BHCs and white vaginal discharge. Cervix closed upon visual exam; swab collected, see orders. Rx Terazol, see orders. NST reactive x 2. Anatomy scan for TWIN B remains incomplete, next appointment scheduled with MFM on 10/31/2020. Encouraged patient to keep appointments as scheduled. Third trimester labs today, diabetes screening option: [employer]. TDaP declined. Blood transfusion consent completed. Anticipatory guidance regarding course of prenatal care. Reviewed red flag symptoms and when to call. RTC x 2 weeks for ROB/NST or sooner if needed.

## 2020-10-31 ENCOUNTER — Other Ambulatory Visit: Payer: Self-pay

## 2020-10-31 ENCOUNTER — Ambulatory Visit: Payer: Medicaid Other | Attending: Certified Nurse Midwife

## 2020-10-31 ENCOUNTER — Ambulatory Visit: Payer: Medicaid Other | Admitting: *Deleted

## 2020-10-31 ENCOUNTER — Other Ambulatory Visit: Payer: Self-pay | Admitting: Certified Nurse Midwife

## 2020-10-31 ENCOUNTER — Encounter: Payer: Self-pay | Admitting: *Deleted

## 2020-10-31 VITALS — BP 111/65 | HR 105

## 2020-10-31 DIAGNOSIS — O365931 Maternal care for other known or suspected poor fetal growth, third trimester, fetus 1: Secondary | ICD-10-CM | POA: Diagnosis not present

## 2020-10-31 DIAGNOSIS — O09893 Supervision of other high risk pregnancies, third trimester: Secondary | ICD-10-CM

## 2020-10-31 DIAGNOSIS — O99013 Anemia complicating pregnancy, third trimester: Secondary | ICD-10-CM | POA: Diagnosis not present

## 2020-10-31 DIAGNOSIS — D649 Anemia, unspecified: Secondary | ICD-10-CM

## 2020-10-31 DIAGNOSIS — Z363 Encounter for antenatal screening for malformations: Secondary | ICD-10-CM

## 2020-10-31 DIAGNOSIS — O30042 Twin pregnancy, dichorionic/diamniotic, second trimester: Secondary | ICD-10-CM | POA: Diagnosis present

## 2020-10-31 DIAGNOSIS — O30043 Twin pregnancy, dichorionic/diamniotic, third trimester: Secondary | ICD-10-CM | POA: Diagnosis present

## 2020-10-31 DIAGNOSIS — E0590 Thyrotoxicosis, unspecified without thyrotoxic crisis or storm: Secondary | ICD-10-CM

## 2020-10-31 DIAGNOSIS — O321XX2 Maternal care for breech presentation, fetus 2: Secondary | ICD-10-CM

## 2020-10-31 DIAGNOSIS — O99283 Endocrine, nutritional and metabolic diseases complicating pregnancy, third trimester: Secondary | ICD-10-CM

## 2020-10-31 DIAGNOSIS — Z3A28 28 weeks gestation of pregnancy: Secondary | ICD-10-CM

## 2020-11-01 ENCOUNTER — Other Ambulatory Visit: Payer: Self-pay | Admitting: *Deleted

## 2020-11-01 DIAGNOSIS — O30043 Twin pregnancy, dichorionic/diamniotic, third trimester: Secondary | ICD-10-CM

## 2020-11-01 NOTE — Progress Notes (Signed)
See report. Thanks, JML

## 2020-11-09 ENCOUNTER — Encounter: Payer: Self-pay | Admitting: Certified Nurse Midwife

## 2020-11-09 ENCOUNTER — Other Ambulatory Visit: Payer: Medicaid Other

## 2020-11-09 ENCOUNTER — Ambulatory Visit (INDEPENDENT_AMBULATORY_CARE_PROVIDER_SITE_OTHER): Payer: Medicaid Other | Admitting: Certified Nurse Midwife

## 2020-11-09 ENCOUNTER — Other Ambulatory Visit: Payer: Self-pay

## 2020-11-09 VITALS — BP 117/70 | HR 87 | Wt 165.1 lb

## 2020-11-09 DIAGNOSIS — Z3A29 29 weeks gestation of pregnancy: Secondary | ICD-10-CM | POA: Diagnosis not present

## 2020-11-09 DIAGNOSIS — E0590 Thyrotoxicosis, unspecified without thyrotoxic crisis or storm: Secondary | ICD-10-CM | POA: Diagnosis not present

## 2020-11-09 DIAGNOSIS — O99281 Endocrine, nutritional and metabolic diseases complicating pregnancy, first trimester: Secondary | ICD-10-CM

## 2020-11-09 DIAGNOSIS — O30043 Twin pregnancy, dichorionic/diamniotic, third trimester: Secondary | ICD-10-CM

## 2020-11-09 NOTE — Progress Notes (Signed)
ROB: She is doing well today. She has no concerns.

## 2020-11-09 NOTE — Patient Instructions (Signed)
Fetal Movement Counts Patient Name: ________________________________________________ Patient DueDate: ____________________ What is a fetal movement count?  A fetal movement count is the number of times that you feel your baby move during a certain amount of time. This may also be called a fetal kick count. A fetal movement count is recommended for every pregnant woman. You may be askedto start counting fetal movements as early as week 28 of your pregnancy. Pay attention to when your baby is most active. You may notice your baby's sleep and wake cycles. You may also notice things that make your baby move more. You should do a fetal movement count: When your baby is normally most active. At the same time each day. A good time to count movements is while you are resting, after having somethingto eat and drink. How do I count fetal movements? Find a quiet, comfortable area. Sit, or lie down on your side. Write down the date, the start time and stop time, and the number of movements that you felt between those two times. Take this information with you to your health care visits. Write down your start time when you feel the first movement. Count kicks, flutters, swishes, rolls, and jabs. You should feel at least 10 movements. You may stop counting after you have felt 10 movements, or if you have been counting for 2 hours. Write down the stop time. If you do not feel 10 movements in 2 hours, contact your health care provider for further instructions. Your health care provider may want to do additional tests to assess your baby's well-being. Contact a health care provider if: You feel fewer than 10 movements in 2 hours. Your baby is not moving like he or she usually does. Date: ____________ Start time: ____________ Stop time: ____________ Movements:____________ Date: ____________ Start time: ____________ Stop time: ____________ Movements:____________ Date: ____________ Start time: ____________ Stop  time: ____________ Movements:____________ Date: ____________ Start time: ____________ Stop time: ____________ Movements:____________ Date: ____________ Start time: ____________ Stop time: ____________ Movements:____________ Date: ____________ Start time: ____________ Stop time: ____________ Movements:____________ Date: ____________ Start time: ____________ Stop time: ____________ Movements:____________ Date: ____________ Start time: ____________ Stop time: ____________ Movements:____________ Date: ____________ Start time: ____________ Stop time: ____________ Movements:____________ This information is not intended to replace advice given to you by your health care provider. Make sure you discuss any questions you have with your healthcare provider. Document Revised: 01/06/2019 Document Reviewed: 01/06/2019 Elsevier Patient Education  2022 Elsevier Inc.  

## 2020-11-09 NOTE — Progress Notes (Signed)
ROB-Reports increased pelvic pressure. Next MFM ultrasound scheduled for 11/15/2020. Discussed ultrasound results with patient; stressed the importance of follow up, verbalized understanding. NST reactive x 2, see chart. Thyroid labs today, see orders. Anticipatory guidance regarding course of prenatal care. Reviewed red flag symptoms and when to call. RTC x 2 weeks for NST and ROB or sooner if needed.

## 2020-11-10 LAB — TSH+T4F+T3FREE
Free T4: 1.32 ng/dL (ref 0.82–1.77)
T3, Free: 3.4 pg/mL (ref 2.0–4.4)
TSH: <0.005 u[IU]/mL — ABNORMAL LOW (ref 0.450–4.500)

## 2020-11-12 NOTE — Progress Notes (Signed)
Thoughts? Thanks, JML

## 2020-11-15 ENCOUNTER — Ambulatory Visit: Payer: Medicaid Other

## 2020-11-15 ENCOUNTER — Ambulatory Visit: Payer: Medicaid Other | Attending: Obstetrics and Gynecology

## 2020-11-15 ENCOUNTER — Telehealth: Payer: Self-pay | Admitting: Certified Nurse Midwife

## 2020-11-15 DIAGNOSIS — O30043 Twin pregnancy, dichorionic/diamniotic, third trimester: Secondary | ICD-10-CM

## 2020-11-15 DIAGNOSIS — Z3A30 30 weeks gestation of pregnancy: Secondary | ICD-10-CM

## 2020-11-15 NOTE — Telephone Encounter (Signed)
Cindy Brock called in and states she is having contractions.  Cindy Brock states her contractions are on and off and she has been having them for the last hour.  Cindy Brock states they feel like period cramps and are radiating to her thighs.  Cindy Brock wants to know if she should be seen or go to the ER.  Cindy Brock states she needs and answer right away because her husband is about to leave for work.  Please advise.

## 2020-11-15 NOTE — Progress Notes (Signed)
She stopped the medication. When do you recommend repeat labs? Thanks, JML

## 2020-11-15 NOTE — Telephone Encounter (Signed)
1431 Telephone call to patient, verified full name and date of birth.   Reports menstrual like cramping for the last two (2) hours; feeling in her thighs and stomach.   Has not drank any water today and a cup of juice. Discussed home treatment measures for symptoms.   FM x 2. Denies bleeding or leakage of fluid.   Cancelled MFM appointments scheduled for today due to lack of sleep.    Serafina Royals, CNM [facility], [facility] 11/15/20 2:34 PM

## 2020-11-19 ENCOUNTER — Other Ambulatory Visit: Payer: Self-pay | Admitting: Certified Nurse Midwife

## 2020-11-19 ENCOUNTER — Other Ambulatory Visit: Payer: Self-pay

## 2020-11-19 ENCOUNTER — Encounter: Payer: Self-pay | Admitting: Certified Nurse Midwife

## 2020-11-19 ENCOUNTER — Other Ambulatory Visit (HOSPITAL_COMMUNITY)
Admission: RE | Admit: 2020-11-19 | Discharge: 2020-11-19 | Disposition: A | Discharge status: Home / Self Care | Payer: Medicaid Other | Source: Ambulatory Visit | Attending: Certified Nurse Midwife | Admitting: Certified Nurse Midwife

## 2020-11-19 ENCOUNTER — Ambulatory Visit (INDEPENDENT_AMBULATORY_CARE_PROVIDER_SITE_OTHER): Payer: Medicaid Other | Admitting: Certified Nurse Midwife

## 2020-11-19 VITALS — BP 115/75 | HR 112 | Wt 165.9 lb

## 2020-11-19 DIAGNOSIS — O26893 Other specified pregnancy related conditions, third trimester: Secondary | ICD-10-CM

## 2020-11-19 DIAGNOSIS — O30023 Conjoined twin pregnancy, third trimester: Secondary | ICD-10-CM | POA: Insufficient documentation

## 2020-11-19 DIAGNOSIS — Z3A31 31 weeks gestation of pregnancy: Secondary | ICD-10-CM | POA: Insufficient documentation

## 2020-11-19 DIAGNOSIS — R102 Pelvic and perineal pain: Secondary | ICD-10-CM | POA: Insufficient documentation

## 2020-11-19 DIAGNOSIS — R109 Unspecified abdominal pain: Secondary | ICD-10-CM

## 2020-11-19 DIAGNOSIS — O26899 Other specified pregnancy related conditions, unspecified trimester: Secondary | ICD-10-CM

## 2020-11-19 DIAGNOSIS — O30043 Twin pregnancy, dichorionic/diamniotic, third trimester: Secondary | ICD-10-CM | POA: Diagnosis not present

## 2020-11-19 LAB — POCT URINALYSIS DIPSTICK OB
Spec Grav, UA: 1.01 (ref 1.010–1.025)
pH, UA: 7.5 (ref 5.0–8.0)

## 2020-11-19 NOTE — Progress Notes (Signed)
ROB: She has been having cramps x 4 days that comes and goes.

## 2020-11-19 NOTE — Patient Instructions (Signed)
Fetal Movement Counts Patient Name: ________________________________________________ Patient DueDate: ____________________ What is a fetal movement count?  A fetal movement count is the number of times that you feel your baby move during a certain amount of time. This may also be called a fetal kick count. A fetal movement count is recommended for every pregnant woman. You may be askedto start counting fetal movements as early as week 28 of your pregnancy. Pay attention to when your baby is most active. You may notice your baby's sleep and wake cycles. You may also notice things that make your baby move more. You should do a fetal movement count: When your baby is normally most active. At the same time each day. A good time to count movements is while you are resting, after having somethingto eat and drink. How do I count fetal movements? Find a quiet, comfortable area. Sit, or lie down on your side. Write down the date, the start time and stop time, and the number of movements that you felt between those two times. Take this information with you to your health care visits. Write down your start time when you feel the first movement. Count kicks, flutters, swishes, rolls, and jabs. You should feel at least 10 movements. You may stop counting after you have felt 10 movements, or if you have been counting for 2 hours. Write down the stop time. If you do not feel 10 movements in 2 hours, contact your health care provider for further instructions. Your health care provider may want to do additional tests to assess your baby's well-being. Contact a health care provider if: You feel fewer than 10 movements in 2 hours. Your baby is not moving like he or she usually does. Date: ____________ Start time: ____________ Stop time: ____________ Movements:____________ Date: ____________ Start time: ____________ Stop time: ____________ Movements:____________ Date: ____________ Start time: ____________ Stop  time: ____________ Movements:____________ Date: ____________ Start time: ____________ Stop time: ____________ Movements:____________ Date: ____________ Start time: ____________ Stop time: ____________ Movements:____________ Date: ____________ Start time: ____________ Stop time: ____________ Movements:____________ Date: ____________ Start time: ____________ Stop time: ____________ Movements:____________ Date: ____________ Start time: ____________ Stop time: ____________ Movements:____________ Date: ____________ Start time: ____________ Stop time: ____________ Movements:____________ This information is not intended to replace advice given to you by your health care provider. Make sure you discuss any questions you have with your healthcare provider. Document Revised: 01/06/2019 Document Reviewed: 01/06/2019 Elsevier Patient Education  2022 Elsevier Inc.  

## 2020-11-19 NOTE — Progress Notes (Signed)
Subjective:   Cindy Brock is a 25 y.o. [id number] [redacted]w[redacted]d being seen today for work in problem prenatal visit.   Patient reports irregular, intermittent cramping for the last four (4) days.   Denies vaginal bleeding or leaking of fluid.  Reports good fetal movement.  The following portions of the patient's history were reviewed and updated as appropriate: allergies, current medications, past family history, past medical history, past social history, past surgical history and problem list.   Review of Systems:  ROS negative except as noted above. Information obtained from patient.   Objective:   BP 115/75   Pulse (!) 112   Wt 165 lb 14.4 oz (75.3 kg)   LMP 04/11/2020   BMI 26.78 kg/m   FHT: Fetal Heart Rate (bpm): 136/152  Uterine Size: Fundal Height: 36 cm  Fetal Movement: Movement: Present    Abdomen:  soft, gravid, appropriate for gestational age,non-tender  Vaginal:  Discharge, grey and white; thick and thin  Cervix: Closed and long on visual inspection; digital exam deferred   Results for orders placed or performed in visit on 11/19/20 (from the past 24 hour(s))  POC Urinalysis Dipstick OB     Status: Normal   Collection Time: 11/19/20  3:39 PM  Result Value Ref Range   Color, UA     Clarity, UA     Glucose, UA     Bilirubin, UA     Ketones, UA     Spec Grav, UA 1.010 1.010 - 1.025   Blood, UA     pH, UA 7.5 5.0 - 8.0   POC,PROTEIN,UA Small (1+) Negative, Trace, Small (1+), Moderate (2+), Large (3+), 4+   Urobilinogen, UA     Nitrite, UA     Leukocytes, UA     Appearance     Odor      Assessment:   Pregnancy:  [id number] at [redacted]w[redacted]d  1. Dichorionic diamniotic twin gestation in third trimester  - POC Urinalysis Dipstick OB - Cervicovaginal ancillary only - Fetal fibronectin  2. [redacted] weeks gestation of pregnancy  - POC Urinalysis Dipstick OB - Cervicovaginal ancillary only - Fetal fibronectin  3. Abdominal cramping affecting pregnancy  - Cervicovaginal  ancillary only - Fetal fibronectin  4. Pelvic pressure in pregnancy, antepartum, third trimester  - Cervicovaginal ancillary only - Fetal fibronectin   Plan:   Fetal fibronectin and vaginal swab collected, see orders.   Preterm labor symptoms: vaginal bleeding, contractions, and leaking of fluid reviewed in detail.  Fetal movement precautions reviewed.  Encouraged to keep appointment for MFM ultrasound scheduled for 11/22/2020.   Reviewed red flag symptoms and when to call.   Follow up as scheduled on 11/23/2020 or sooner if needed.    Serafina Royals, CNM [facility], [facility] 11/19/20 5:20 PM

## 2020-11-20 LAB — FETAL FIBRONECTIN: Fetal Fibronectin: NEGATIVE

## 2020-11-21 LAB — CERVICOVAGINAL ANCILLARY ONLY
Bacterial Vaginitis (gardnerella): NEGATIVE
Candida Glabrata: NEGATIVE
Candida Vaginitis: NEGATIVE
Comment: NEGATIVE
Comment: NEGATIVE
Comment: NEGATIVE

## 2020-11-22 ENCOUNTER — Ambulatory Visit: Payer: Medicaid Other | Admitting: *Deleted

## 2020-11-22 ENCOUNTER — Other Ambulatory Visit: Payer: Self-pay | Admitting: Obstetrics and Gynecology

## 2020-11-22 ENCOUNTER — Other Ambulatory Visit: Payer: Self-pay | Admitting: *Deleted

## 2020-11-22 ENCOUNTER — Encounter: Payer: Self-pay | Admitting: *Deleted

## 2020-11-22 ENCOUNTER — Ambulatory Visit: Payer: Medicaid Other | Attending: Obstetrics and Gynecology

## 2020-11-22 ENCOUNTER — Other Ambulatory Visit: Payer: Self-pay

## 2020-11-22 VITALS — BP 125/71 | HR 102

## 2020-11-22 DIAGNOSIS — O30043 Twin pregnancy, dichorionic/diamniotic, third trimester: Secondary | ICD-10-CM

## 2020-11-22 DIAGNOSIS — Z363 Encounter for antenatal screening for malformations: Secondary | ICD-10-CM

## 2020-11-22 DIAGNOSIS — O365931 Maternal care for other known or suspected poor fetal growth, third trimester, fetus 1: Secondary | ICD-10-CM

## 2020-11-22 DIAGNOSIS — D649 Anemia, unspecified: Secondary | ICD-10-CM

## 2020-11-22 DIAGNOSIS — Z3A31 31 weeks gestation of pregnancy: Secondary | ICD-10-CM

## 2020-11-22 DIAGNOSIS — O09893 Supervision of other high risk pregnancies, third trimester: Secondary | ICD-10-CM

## 2020-11-22 DIAGNOSIS — O99013 Anemia complicating pregnancy, third trimester: Secondary | ICD-10-CM

## 2020-11-22 NOTE — Procedures (Signed)
Estel Tonelli 1995-07-06 [redacted]w[redacted]d   Fetus B Non-Stress Test Interpretation for 11/22/20  Indication: Unsatisfactory BPP  Fetal Heart Rate Fetus B Mode: External Baseline Rate (B): 145 BPM Variability: Moderate Accelerations: 15 x 15 Decelerations: None  Uterine Activity Mode: Palpation, Toco Contraction Frequency (min): 2-7 Contraction Duration (sec): 40-90 Contraction Quality: Mild Resting Tone Palpated: Relaxed Resting Time: Adequate  Interpretation (Baby B - Fetal Testing) Nonstress Test Interpretation (Baby B): Reactive Comments (Baby B): Dr. Judeth Cornfield reviewed tracing  Zarai Orsborn [birth year] [redacted]w[redacted]d  Fetus A Non-Stress Test Interpretation for 11/22/20  Indication: Unsatisfactory BPP  Fetal Heart Rate A Mode: External Baseline Rate (A): 140 bpm Variability: Moderate Accelerations: 15 x 15 Decelerations: None Multiple birth?: Yes  Uterine Activity Mode: Palpation, Toco Contraction Frequency (min): 2-7 Contraction Duration (sec): 40-90 Contraction Quality: Mild Resting Tone Palpated: Relaxed Resting Time: Adequate  Interpretation (Fetal Testing) Nonstress Test Interpretation: Reactive Overall Impression: Reassuring for gestational age Comments: Dr. Judeth Cornfield reviewed tracing.

## 2020-11-23 ENCOUNTER — Other Ambulatory Visit: Payer: Medicaid Other

## 2020-11-23 ENCOUNTER — Encounter: Payer: Self-pay | Admitting: Certified Nurse Midwife

## 2020-11-23 ENCOUNTER — Ambulatory Visit (INDEPENDENT_AMBULATORY_CARE_PROVIDER_SITE_OTHER): Payer: Medicaid Other | Admitting: Certified Nurse Midwife

## 2020-11-23 VITALS — BP 103/70 | HR 112 | Wt 168.0 lb

## 2020-11-23 DIAGNOSIS — Z3A31 31 weeks gestation of pregnancy: Secondary | ICD-10-CM

## 2020-11-23 DIAGNOSIS — O30043 Twin pregnancy, dichorionic/diamniotic, third trimester: Secondary | ICD-10-CM

## 2020-11-23 NOTE — Progress Notes (Signed)
ROB: No complaints today. Unable to leave urine at intake.

## 2020-11-23 NOTE — Progress Notes (Signed)
ROB-Doing well, no questions or concerns. "Ready to be done, over it". BPP and NST yesterday at MFM, see chart. Next MFM visit 11/29/2020. Discussed recommendations for IOL at [redacted] week gestation-tentative date 12/24/2020 at 0800. Reviewed red flag symptoms and when to call. RTC x 2 weeks for ROB with JML or sooner if needed

## 2020-11-23 NOTE — Patient Instructions (Signed)
Fetal Movement Counts Patient Name: ________________________________________________ Patient DueDate: ____________________ What is a fetal movement count?  A fetal movement count is the number of times that you feel your baby move during a certain amount of time. This may also be called a fetal kick count. A fetal movement count is recommended for every pregnant woman. You may be askedto start counting fetal movements as early as week 28 of your pregnancy. Pay attention to when your baby is most active. You may notice your baby's sleep and wake cycles. You may also notice things that make your baby move more. You should do a fetal movement count: When your baby is normally most active. At the same time each day. A good time to count movements is while you are resting, after having somethingto eat and drink. How do I count fetal movements? Find a quiet, comfortable area. Sit, or lie down on your side. Write down the date, the start time and stop time, and the number of movements that you felt between those two times. Take this information with you to your health care visits. Write down your start time when you feel the first movement. Count kicks, flutters, swishes, rolls, and jabs. You should feel at least 10 movements. You may stop counting after you have felt 10 movements, or if you have been counting for 2 hours. Write down the stop time. If you do not feel 10 movements in 2 hours, contact your health care provider for further instructions. Your health care provider may want to do additional tests to assess your baby's well-being. Contact a health care provider if: You feel fewer than 10 movements in 2 hours. Your baby is not moving like he or she usually does. Date: ____________ Start time: ____________ Stop time: ____________ Movements:____________ Date: ____________ Start time: ____________ Stop time: ____________ Movements:____________ Date: ____________ Start time: ____________ Stop  time: ____________ Movements:____________ Date: ____________ Start time: ____________ Stop time: ____________ Movements:____________ Date: ____________ Start time: ____________ Stop time: ____________ Movements:____________ Date: ____________ Start time: ____________ Stop time: ____________ Movements:____________ Date: ____________ Start time: ____________ Stop time: ____________ Movements:____________ Date: ____________ Start time: ____________ Stop time: ____________ Movements:____________ Date: ____________ Start time: ____________ Stop time: ____________ Movements:____________ This information is not intended to replace advice given to you by your health care provider. Make sure you discuss any questions you have with your healthcare provider. Document Revised: 01/06/2019 Document Reviewed: 01/06/2019 Elsevier Patient Education  2022 Elsevier Inc.  

## 2020-11-29 ENCOUNTER — Ambulatory Visit: Payer: Medicaid Other | Attending: Obstetrics and Gynecology

## 2020-11-29 ENCOUNTER — Other Ambulatory Visit: Payer: Medicaid Other

## 2020-11-29 ENCOUNTER — Ambulatory Visit: Payer: Medicaid Other

## 2020-12-06 ENCOUNTER — Other Ambulatory Visit: Payer: Self-pay | Admitting: Obstetrics and Gynecology

## 2020-12-06 ENCOUNTER — Encounter: Payer: Self-pay | Admitting: *Deleted

## 2020-12-06 ENCOUNTER — Other Ambulatory Visit: Payer: Self-pay

## 2020-12-06 ENCOUNTER — Ambulatory Visit: Payer: Medicaid Other

## 2020-12-06 ENCOUNTER — Ambulatory Visit: Payer: Medicaid Other | Admitting: *Deleted

## 2020-12-06 ENCOUNTER — Ambulatory Visit: Payer: Medicaid Other | Attending: Obstetrics and Gynecology

## 2020-12-06 VITALS — BP 118/70 | HR 115

## 2020-12-06 DIAGNOSIS — O99013 Anemia complicating pregnancy, third trimester: Secondary | ICD-10-CM

## 2020-12-06 DIAGNOSIS — O30043 Twin pregnancy, dichorionic/diamniotic, third trimester: Secondary | ICD-10-CM

## 2020-12-06 DIAGNOSIS — O365931 Maternal care for other known or suspected poor fetal growth, third trimester, fetus 1: Secondary | ICD-10-CM

## 2020-12-06 DIAGNOSIS — Z3A33 33 weeks gestation of pregnancy: Secondary | ICD-10-CM | POA: Diagnosis present

## 2020-12-06 DIAGNOSIS — D649 Anemia, unspecified: Secondary | ICD-10-CM

## 2020-12-06 DIAGNOSIS — O09893 Supervision of other high risk pregnancies, third trimester: Secondary | ICD-10-CM

## 2020-12-06 DIAGNOSIS — O321XX2 Maternal care for breech presentation, fetus 2: Secondary | ICD-10-CM

## 2020-12-06 NOTE — Progress Notes (Signed)
Pt states "unable to stay for NST today because I have to go to work right after U/S, but I have a NST scheduled at my [facility] doctor tomorrow."

## 2020-12-07 ENCOUNTER — Encounter: Payer: Self-pay | Admitting: Certified Nurse Midwife

## 2020-12-07 ENCOUNTER — Ambulatory Visit (INDEPENDENT_AMBULATORY_CARE_PROVIDER_SITE_OTHER): Payer: Medicaid Other | Admitting: Certified Nurse Midwife

## 2020-12-07 VITALS — BP 108/63 | HR 106 | Wt 170.9 lb

## 2020-12-07 DIAGNOSIS — Z3A33 33 weeks gestation of pregnancy: Secondary | ICD-10-CM

## 2020-12-07 DIAGNOSIS — O30043 Twin pregnancy, dichorionic/diamniotic, third trimester: Secondary | ICD-10-CM | POA: Diagnosis not present

## 2020-12-07 NOTE — Progress Notes (Signed)
ROB-Doing well, no questions or concerns. MFM appointment yesterday, see note. NST today reactive x 2, see chart. Anticipatory guidance regarding course of prenatal care. Reviewed red flag symptoms and when to call. Follow up with MFM next week as scheduled. RTC x 2 weeks or sooner if needed.

## 2020-12-07 NOTE — Progress Notes (Signed)
ROB: Unable to urinate at intake, will leave sample before she leaves. NST today.

## 2020-12-07 NOTE — Patient Instructions (Signed)
Fetal Movement Counts Patient Name: ________________________________________________ Patient DueDate: ____________________ What is a fetal movement count?  A fetal movement count is the number of times that you feel your baby move during a certain amount of time. This may also be called a fetal kick count. A fetal movement count is recommended for every pregnant woman. You may be askedto start counting fetal movements as early as week 28 of your pregnancy. Pay attention to when your baby is most active. You may notice your baby's sleep and wake cycles. You may also notice things that make your baby move more. You should do a fetal movement count: When your baby is normally most active. At the same time each day. A good time to count movements is while you are resting, after having somethingto eat and drink. How do I count fetal movements? Find a quiet, comfortable area. Sit, or lie down on your side. Write down the date, the start time and stop time, and the number of movements that you felt between those two times. Take this information with you to your health care visits. Write down your start time when you feel the first movement. Count kicks, flutters, swishes, rolls, and jabs. You should feel at least 10 movements. You may stop counting after you have felt 10 movements, or if you have been counting for 2 hours. Write down the stop time. If you do not feel 10 movements in 2 hours, contact your health care provider for further instructions. Your health care provider may want to do additional tests to assess your baby's well-being. Contact a health care provider if: You feel fewer than 10 movements in 2 hours. Your baby is not moving like he or she usually does. Date: ____________ Start time: ____________ Stop time: ____________ Movements:____________ Date: ____________ Start time: ____________ Stop time: ____________ Movements:____________ Date: ____________ Start time: ____________ Stop  time: ____________ Movements:____________ Date: ____________ Start time: ____________ Stop time: ____________ Movements:____________ Date: ____________ Start time: ____________ Stop time: ____________ Movements:____________ Date: ____________ Start time: ____________ Stop time: ____________ Movements:____________ Date: ____________ Start time: ____________ Stop time: ____________ Movements:____________ Date: ____________ Start time: ____________ Stop time: ____________ Movements:____________ Date: ____________ Start time: ____________ Stop time: ____________ Movements:____________ This information is not intended to replace advice given to you by your health care provider. Make sure you discuss any questions you have with your healthcare provider. Document Revised: 01/06/2019 Document Reviewed: 01/06/2019 Elsevier Patient Education  2022 Elsevier Inc.  

## 2020-12-13 ENCOUNTER — Ambulatory Visit: Payer: Medicaid Other | Attending: Obstetrics and Gynecology

## 2020-12-13 ENCOUNTER — Other Ambulatory Visit: Payer: Self-pay | Admitting: Obstetrics and Gynecology

## 2020-12-13 ENCOUNTER — Ambulatory Visit: Payer: Medicaid Other | Admitting: *Deleted

## 2020-12-13 ENCOUNTER — Other Ambulatory Visit: Payer: Self-pay

## 2020-12-13 VITALS — BP 101/53 | HR 97

## 2020-12-13 DIAGNOSIS — O99013 Anemia complicating pregnancy, third trimester: Secondary | ICD-10-CM | POA: Diagnosis not present

## 2020-12-13 DIAGNOSIS — O365931 Maternal care for other known or suspected poor fetal growth, third trimester, fetus 1: Secondary | ICD-10-CM | POA: Diagnosis not present

## 2020-12-13 DIAGNOSIS — O30049 Twin pregnancy, dichorionic/diamniotic, unspecified trimester: Secondary | ICD-10-CM

## 2020-12-13 DIAGNOSIS — O30043 Twin pregnancy, dichorionic/diamniotic, third trimester: Secondary | ICD-10-CM

## 2020-12-13 DIAGNOSIS — O09893 Supervision of other high risk pregnancies, third trimester: Secondary | ICD-10-CM

## 2020-12-13 DIAGNOSIS — D649 Anemia, unspecified: Secondary | ICD-10-CM

## 2020-12-13 DIAGNOSIS — Z3A34 34 weeks gestation of pregnancy: Secondary | ICD-10-CM

## 2020-12-13 NOTE — Procedures (Signed)
Cindy Brock 1996/05/24 [redacted]w[redacted]d  Fetus A Non-Stress Test Interpretation for 12/13/20  Indication: IUGRLalah Facer April 30, 1996 [redacted]w[redacted]d   Fetus B Non-Stress Test Interpretation for 12/13/20  Indication:  [employer] twins, twin A IUGR  Fetal Heart Rate Fetus B Mode: External Baseline Rate (B): 150 BPM Variability: Moderate Accelerations: 15 x 15 Decelerations: None  Uterine Activity Mode: Palpation, Toco Contraction Frequency (min): 3 UC's Contraction Duration (sec): 80-100 Contraction Quality: Mild Resting Tone Palpated: Relaxed Resting Time: Adequate  Interpretation (Baby B - Fetal Testing) Nonstress Test Interpretation (Baby B): Reactive Overall Impression (Baby B): Reassuring for gestational age Comments (Baby B): Dr. Judeth Cornfield reviewed tracing.    Fetal Heart Rate A Mode: External Baseline Rate (A): 140 bpm Variability: Moderate Accelerations: 15 x 15 Decelerations: None Multiple birth?: Yes  Uterine Activity Mode: Palpation, Toco Contraction Frequency (min): 3 UC's Contraction Duration (sec): 80-100 Contraction Quality: Mild Resting Tone Palpated: Relaxed Resting Time: Adequate  Interpretation (Fetal Testing) Nonstress Test Interpretation: Reactive Overall Impression: Reassuring for gestational age Comments: Dr. Judeth Cornfield reviewed tracing.

## 2020-12-13 NOTE — Progress Notes (Signed)
C/o" swelling in left foot." +1 pitting edema noted.

## 2020-12-14 ENCOUNTER — Other Ambulatory Visit: Payer: Self-pay | Admitting: Certified Nurse Midwife

## 2020-12-14 ENCOUNTER — Encounter: Payer: Self-pay | Admitting: Obstetrics and Gynecology

## 2020-12-14 ENCOUNTER — Other Ambulatory Visit: Payer: Self-pay

## 2020-12-14 ENCOUNTER — Observation Stay
Admission: RE | Admit: 2020-12-14 | Discharge: 2020-12-14 | Disposition: A | Discharge status: Home / Self Care | Payer: Medicaid Other | Attending: Certified Nurse Midwife | Admitting: Certified Nurse Midwife

## 2020-12-14 DIAGNOSIS — O30043 Twin pregnancy, dichorionic/diamniotic, third trimester: Secondary | ICD-10-CM | POA: Diagnosis not present

## 2020-12-14 DIAGNOSIS — O1203 Gestational edema, third trimester: Secondary | ICD-10-CM

## 2020-12-14 DIAGNOSIS — J45909 Unspecified asthma, uncomplicated: Secondary | ICD-10-CM | POA: Insufficient documentation

## 2020-12-14 DIAGNOSIS — O99891 Other specified diseases and conditions complicating pregnancy: Secondary | ICD-10-CM | POA: Diagnosis not present

## 2020-12-14 DIAGNOSIS — M549 Dorsalgia, unspecified: Secondary | ICD-10-CM

## 2020-12-14 DIAGNOSIS — O99513 Diseases of the respiratory system complicating pregnancy, third trimester: Secondary | ICD-10-CM | POA: Insufficient documentation

## 2020-12-14 DIAGNOSIS — O99013 Anemia complicating pregnancy, third trimester: Secondary | ICD-10-CM

## 2020-12-14 DIAGNOSIS — Z3A34 34 weeks gestation of pregnancy: Secondary | ICD-10-CM | POA: Diagnosis not present

## 2020-12-14 MED ORDER — LIDOCAINE 5 % EX PTCH: 1.0000 | MEDICATED_PATCH | CUTANEOUS | 0 refills | Status: DC

## 2020-12-14 MED ORDER — CYCLOBENZAPRINE HCL 10 MG PO TABS: 10.0000 mg | ORAL_TABLET | Freq: Three times a day (TID) | ORAL | 0 refills | Status: DC | PRN

## 2020-12-14 MED ORDER — ACETAMINOPHEN 500 MG PO TABS
1000.0000 mg | ORAL_TABLET | Freq: Four times a day (QID) | ORAL | Status: DC | PRN
  Administered 2020-12-14: 1000 mg via ORAL
  Filled 2020-12-14: qty 2

## 2020-12-14 MED ORDER — LIDOCAINE 5 % EX PTCH
1.0000 | MEDICATED_PATCH | CUTANEOUS | Status: DC
  Administered 2020-12-14: 1 via TRANSDERMAL
  Filled 2020-12-14: qty 1

## 2020-12-14 MED ORDER — CYCLOBENZAPRINE HCL 10 MG PO TABS
10.0000 mg | ORAL_TABLET | Freq: Three times a day (TID) | ORAL | Status: DC | PRN
  Administered 2020-12-14: 10 mg via ORAL
  Filled 2020-12-14 (×2): qty 1

## 2020-12-14 MED ORDER — ACETAMINOPHEN 500 MG PO TABS: 1000.0000 mg | ORAL_TABLET | Freq: Four times a day (QID) | ORAL | 0 refills | Status: DC | PRN

## 2020-12-14 NOTE — Discharge Instructions (Signed)
WEAR TED HOSE DRINK PLENTY OF H20

## 2020-12-14 NOTE — OB Triage Note (Signed)
Pt presented to L/D triage with reported back pain that has been ongoing for the past week and has worsened today. The pain is constant, rated 10/10. Pt reports intermittent ctx that are mild. No urinary symptoms. Pt reports no PIH symptoms. +2 reflexes, no clonus. Pt is alert and oriented- talking and smiling. Pt reports left food swelling. Nonpitting edema noted. Pt has no calf pain, tenderness, or swelling. Pt reports positive fetal movement with no bleeding or LOF. Monitors applied and assessing. VSS.

## 2020-12-14 NOTE — OB Triage Note (Signed)
Pt discharged home per order.  Pt stable and ambulatory and an After Visit Summary was printed and given to the patient. Discharge education completed with patient including follow up instructions, appointments, and medication list. Pt received labor precautions. Patient able to verbalize understanding, all questions fully answered upon discharge.  Pt discharged home via personal vehicle with child. Pain 0/10.

## 2020-12-14 NOTE — Progress Notes (Addendum)
Monitors removed per CNM after reactive NST. Pt aware of contractions but describes them as nonpainful.

## 2020-12-15 DIAGNOSIS — O1203 Gestational edema, third trimester: Secondary | ICD-10-CM

## 2020-12-15 DIAGNOSIS — O30043 Twin pregnancy, dichorionic/diamniotic, third trimester: Secondary | ICD-10-CM

## 2020-12-15 NOTE — Discharge Summary (Signed)
Obstetric Discharge Summary  Patient ID: Cindy Brock MRN: [id number] DOB/AGE: 1996/02/14 25 y.o.   Date of Admission: 12/14/2020  Date of Discharge: 12/14/2020  Admitting Diagnosis: Observation at [redacted]w[redacted]d  Secondary Diagnosis:   Patient Active Problem List   Diagnosis Date Noted   Indication for care in labor and delivery, antepartum 12/14/2020   Anemia affecting pregnancy 10/26/2020   Pregnancy related hip pain, antepartum, second trimester 09/24/2020   Short interval between pregnancies affecting pregnancy in first trimester, antepartum 07/12/2020   Immunization consent not given 10/03/2019   Rubella non-immune status, antepartum 09/12/2019   Maternal varicella, non-immune 09/12/2019   Hyperthyroidism affecting pregnancy in second trimester 06/14/2019   History of asthma 06/14/2019     Discharge Diagnosis: No other diagnosis   Antepartum Procedures: NST, application of TED hose, oral hydration, and medications-see [facility]  Brief Hospital Course   L&D OB Triage Note  Cindy Brock is a 25 y.o. [id number] female at [redacted]w[redacted]d, EDD Estimated Date of Delivery: 01/21/21 who presented to triage for complaints of back pain for the last four (4) weeks and intermittent lower extremity swelling.  She was evaluated by the nurses with no significant findings for maternal/fetal distress, preterm labor, or pre-eclampsia. Vital signs stable. An NST was performed and has been reviewed by CNM. She was treated with Tylenol, Flexeril, and a Lidocaine patch. TED hose were applied.   NST INTERPRETATION: Indications: rule out uterine contractions  Mode: External Baseline Rate (A): 140 bpm Variability: Moderate Accelerations: 15 x 15 Decelerations: None  Mode External Baseline Rate (B): 145 bpm Accelerations: 15x15 Decelerations: None  Contraction Frequency (min): occ with UI  Impression: reactive   Plan: NST performed was reviewed and was found to be reactive. She was discharged home with  bleeding/labor precautions.  Continue routine prenatal care. Follow up with OB/GYN as previously scheduled.    Discharge Instructions: Per After Visit Summary.  Activity: Also refer to After Visit Summary.  Diet: Regular  Medications:  Allergies as of 12/14/2020   No Known Allergies      Medication List     TAKE these medications    acetaminophen 500 MG tablet Commonly known as: TYLENOL Take 2 tablets (1,000 mg total) by mouth every 6 (six) hours as needed for fever or headache.   cyclobenzaprine 10 MG tablet Commonly known as: FLEXERIL Take 1 tablet (10 mg total) by mouth 3 (three) times daily as needed for muscle spasms.   lidocaine 5 % Commonly known as: LIDODERM Place 1 patch onto the skin daily. Remove & Discard patch within 12 hours or as directed by MD Notes to patient: Salonpas   PRENATAL 1 PO Take by mouth.       Outpatient follow up:   Follow-up Information     [facility] Follow up.   Why: If symptoms worsen, As needed [phone]               Postpartum contraception:  Uncertain; will discuss further  Discharged Condition: stable  Discharged to: home   Serafina Royals, CNM  [facility], [facility] 12/15/20 1:15 PM

## 2020-12-20 NOTE — Patient Instructions (Signed)

## 2020-12-21 ENCOUNTER — Ambulatory Visit (INDEPENDENT_AMBULATORY_CARE_PROVIDER_SITE_OTHER): Payer: Medicaid Other | Admitting: Obstetrics and Gynecology

## 2020-12-21 ENCOUNTER — Other Ambulatory Visit: Payer: Self-pay

## 2020-12-21 ENCOUNTER — Encounter: Payer: Self-pay | Admitting: Obstetrics and Gynecology

## 2020-12-21 VITALS — BP 104/72 | HR 123 | Wt 174.5 lb

## 2020-12-21 DIAGNOSIS — Z3A35 35 weeks gestation of pregnancy: Secondary | ICD-10-CM

## 2020-12-21 DIAGNOSIS — Z3403 Encounter for supervision of normal first pregnancy, third trimester: Secondary | ICD-10-CM

## 2020-12-21 NOTE — Progress Notes (Signed)
ROB: She has been having some Braxton Hick's contractions. She was ordered to be put on NST, she said she can't do it today, husband has to be at work soon. Unable to leave urine at intake.

## 2020-12-21 NOTE — Progress Notes (Signed)
Patient left prior to being seen by provider. Declined NST today stating she could not stay, as her husband had to be at work. Is scheduled for induction next week.

## 2020-12-24 ENCOUNTER — Encounter: Payer: Self-pay | Admitting: Obstetrics and Gynecology

## 2020-12-24 ENCOUNTER — Encounter
Admission: EM | Disposition: A | Discharge status: Home / Self Care | Payer: Self-pay | Source: Home / Self Care | Attending: Obstetrics and Gynecology

## 2020-12-24 ENCOUNTER — Inpatient Hospital Stay: Payer: Medicaid Other | Admitting: Certified Registered Nurse Anesthetist

## 2020-12-24 ENCOUNTER — Inpatient Hospital Stay
Admission: EM | Admit: 2020-12-24 | Discharge: 2020-12-26 | DRG: 807 | Disposition: A | Discharge status: Home / Self Care | Payer: Medicaid Other | Attending: Obstetrics and Gynecology | Admitting: Obstetrics and Gynecology

## 2020-12-24 ENCOUNTER — Other Ambulatory Visit: Payer: Self-pay

## 2020-12-24 DIAGNOSIS — O321XX2 Maternal care for breech presentation, fetus 2: Secondary | ICD-10-CM | POA: Diagnosis present

## 2020-12-24 DIAGNOSIS — O99282 Endocrine, nutritional and metabolic diseases complicating pregnancy, second trimester: Secondary | ICD-10-CM | POA: Diagnosis present

## 2020-12-24 DIAGNOSIS — O99019 Anemia complicating pregnancy, unspecified trimester: Secondary | ICD-10-CM | POA: Diagnosis present

## 2020-12-24 DIAGNOSIS — E0590 Thyrotoxicosis, unspecified without thyrotoxic crisis or storm: Secondary | ICD-10-CM | POA: Diagnosis present

## 2020-12-24 DIAGNOSIS — O30043 Twin pregnancy, dichorionic/diamniotic, third trimester: Secondary | ICD-10-CM | POA: Diagnosis present

## 2020-12-24 DIAGNOSIS — O365931 Maternal care for other known or suspected poor fetal growth, third trimester, fetus 1: Secondary | ICD-10-CM | POA: Diagnosis present

## 2020-12-24 DIAGNOSIS — O09899 Supervision of other high risk pregnancies, unspecified trimester: Secondary | ICD-10-CM

## 2020-12-24 DIAGNOSIS — O9081 Anemia of the puerperium: Secondary | ICD-10-CM | POA: Diagnosis not present

## 2020-12-24 DIAGNOSIS — Z2839 Other underimmunization status: Secondary | ICD-10-CM

## 2020-12-24 DIAGNOSIS — O09891 Supervision of other high risk pregnancies, first trimester: Secondary | ICD-10-CM

## 2020-12-24 DIAGNOSIS — O42113 Preterm premature rupture of membranes, onset of labor more than 24 hours following rupture, third trimester: Secondary | ICD-10-CM | POA: Diagnosis present

## 2020-12-24 DIAGNOSIS — O42013 Preterm premature rupture of membranes, onset of labor within 24 hours of rupture, third trimester: Secondary | ICD-10-CM

## 2020-12-24 DIAGNOSIS — O99824 Streptococcus B carrier state complicating childbirth: Secondary | ICD-10-CM | POA: Diagnosis present

## 2020-12-24 DIAGNOSIS — O99013 Anemia complicating pregnancy, third trimester: Secondary | ICD-10-CM

## 2020-12-24 DIAGNOSIS — Z8709 Personal history of other diseases of the respiratory system: Secondary | ICD-10-CM

## 2020-12-24 DIAGNOSIS — Z2821 Immunization not carried out because of patient refusal: Secondary | ICD-10-CM | POA: Diagnosis present

## 2020-12-24 DIAGNOSIS — O42913 Preterm premature rupture of membranes, unspecified as to length of time between rupture and onset of labor, third trimester: Secondary | ICD-10-CM | POA: Diagnosis present

## 2020-12-24 DIAGNOSIS — Z3A36 36 weeks gestation of pregnancy: Secondary | ICD-10-CM | POA: Diagnosis not present

## 2020-12-24 DIAGNOSIS — Z20822 Contact with and (suspected) exposure to covid-19: Secondary | ICD-10-CM | POA: Diagnosis present

## 2020-12-24 DIAGNOSIS — O99284 Endocrine, nutritional and metabolic diseases complicating childbirth: Secondary | ICD-10-CM | POA: Diagnosis present

## 2020-12-24 LAB — TYPE AND SCREEN
ABO/RH(D): O POS
Antibody Screen: NEGATIVE

## 2020-12-24 LAB — CBC WITH DIFFERENTIAL/PLATELET
Abs Immature Granulocytes: 0.05 10*3/uL (ref 0.00–0.07)
Basophils Absolute: 0.1 10*3/uL (ref 0.0–0.1)
Basophils Relative: 1 %
Eosinophils Absolute: 0.3 10*3/uL (ref 0.0–0.5)
Eosinophils Relative: 4 %
HCT: 29.4 % — ABNORMAL LOW (ref 36.0–46.0)
Hemoglobin: 9 g/dL — ABNORMAL LOW (ref 12.0–15.0)
Immature Granulocytes: 1 %
Lymphocytes Relative: 28 %
Lymphs Abs: 2.5 10*3/uL (ref 0.7–4.0)
MCH: 20.2 pg — ABNORMAL LOW (ref 26.0–34.0)
MCHC: 30.6 g/dL (ref 30.0–36.0)
MCV: 66.1 fL — ABNORMAL LOW (ref 80.0–100.0)
Monocytes Absolute: 0.9 10*3/uL (ref 0.1–1.0)
Monocytes Relative: 10 %
Neutro Abs: 5.1 10*3/uL (ref 1.7–7.7)
Neutrophils Relative %: 56 %
Platelets: 277 10*3/uL (ref 150–400)
RBC: 4.45 MIL/uL (ref 3.87–5.11)
RDW: 18.6 % — ABNORMAL HIGH (ref 11.5–15.5)
Smear Review: NORMAL
WBC: 9 10*3/uL (ref 4.0–10.5)
nRBC: 0.2 % (ref 0.0–0.2)

## 2020-12-24 LAB — GROUP B STREP BY PCR: Group B strep by PCR: POSITIVE — AB

## 2020-12-24 LAB — RESP PANEL BY RT-PCR (FLU A&B, COVID) ARPGX2
Influenza A by PCR: NEGATIVE
Influenza B by PCR: NEGATIVE
SARS Coronavirus 2 by RT PCR: NEGATIVE

## 2020-12-24 LAB — HIV ANTIBODY (ROUTINE TESTING W REFLEX): HIV Screen 4th Generation wRfx: NONREACTIVE

## 2020-12-24 LAB — HEPATITIS B SURFACE ANTIGEN: Hepatitis B Surface Ag: NONREACTIVE

## 2020-12-24 SURGERY — Surgical Case

## 2020-12-24 MED ORDER — SENNOSIDES-DOCUSATE SODIUM 8.6-50 MG PO TABS
2.0000 | ORAL_TABLET | Freq: Every day | ORAL | Status: DC
  Administered 2020-12-25 – 2020-12-26 (×2): 2 via ORAL
  Filled 2020-12-24 (×2): qty 2

## 2020-12-24 MED ORDER — AMMONIA AROMATIC IN INHA
RESPIRATORY_TRACT | Status: AC
  Filled 2020-12-24: qty 10

## 2020-12-24 MED ORDER — ACETAMINOPHEN 325 MG PO TABS: 650.0000 mg | ORAL_TABLET | ORAL | Status: DC | PRN

## 2020-12-24 MED ORDER — SIMETHICONE 80 MG PO CHEW: 80.0000 mg | CHEWABLE_TABLET | ORAL | Status: DC | PRN

## 2020-12-24 MED ORDER — FENTANYL CITRATE (PF) 100 MCG/2ML IJ SOLN
50.0000 ug | INTRAMUSCULAR | Status: DC | PRN
  Administered 2020-12-24: 50 ug via INTRAMUSCULAR

## 2020-12-24 MED ORDER — SODIUM CHLORIDE 0.9% FLUSH: 3.0000 mL | INTRAVENOUS | Status: DC | PRN

## 2020-12-24 MED ORDER — BUTORPHANOL TARTRATE 1 MG/ML IJ SOLN
1.0000 mg | Freq: Once | INTRAMUSCULAR | Status: AC
  Administered 2020-12-24: 1 mg via INTRAMUSCULAR
  Filled 2020-12-24: qty 1

## 2020-12-24 MED ORDER — PRENATAL MULTIVITAMIN CH
1.0000 | ORAL_TABLET | Freq: Every day | ORAL | Status: DC
  Administered 2020-12-25 – 2020-12-26 (×2): 1 via ORAL
  Filled 2020-12-24 (×2): qty 1

## 2020-12-24 MED ORDER — BENZOCAINE-MENTHOL 20-0.5 % EX AERO: 1.0000 "application " | INHALATION_SPRAY | CUTANEOUS | Status: DC | PRN

## 2020-12-24 MED ORDER — IBUPROFEN 600 MG PO TABS
600.0000 mg | ORAL_TABLET | Freq: Four times a day (QID) | ORAL | Status: DC
  Administered 2020-12-24 – 2020-12-26 (×9): 600 mg via ORAL
  Filled 2020-12-24 (×10): qty 1

## 2020-12-24 MED ORDER — OXYCODONE-ACETAMINOPHEN 5-325 MG PO TABS: 2.0000 | ORAL_TABLET | ORAL | Status: DC | PRN

## 2020-12-24 MED ORDER — LIDOCAINE HCL (PF) 1 % IJ SOLN: 30.0000 mL | INTRAMUSCULAR | Status: DC | PRN

## 2020-12-24 MED ORDER — SODIUM CHLORIDE 0.9 % IV SOLN: 250.0000 mL | INTRAVENOUS | Status: DC | PRN

## 2020-12-24 MED ORDER — PENICILLIN G POTASSIUM 5000000 UNITS IJ SOLR
5.0000 10*6.[IU] | INTRAMUSCULAR | Status: DC
  Administered 2020-12-24: 5 10*6.[IU] via INTRAMUSCULAR
  Filled 2020-12-24 (×4): qty 5

## 2020-12-24 MED ORDER — LIDOCAINE HCL (PF) 1 % IJ SOLN
INTRAMUSCULAR | Status: AC
  Filled 2020-12-24: qty 30

## 2020-12-24 MED ORDER — OXYTOCIN-SODIUM CHLORIDE 30-0.9 UT/500ML-% IV SOLN
INTRAVENOUS | Status: AC
  Filled 2020-12-24: qty 1000

## 2020-12-24 MED ORDER — FENTANYL CITRATE (PF) 100 MCG/2ML IJ SOLN
INTRAMUSCULAR | Status: AC
  Filled 2020-12-24: qty 2

## 2020-12-24 MED ORDER — OXYCODONE-ACETAMINOPHEN 5-325 MG PO TABS
1.0000 | ORAL_TABLET | ORAL | Status: DC | PRN
  Administered 2020-12-24: 1 via ORAL
  Administered 2020-12-25 – 2020-12-26 (×6): 2 via ORAL
  Filled 2020-12-24 (×4): qty 2
  Filled 2020-12-24: qty 1
  Filled 2020-12-24 (×3): qty 2

## 2020-12-24 MED ORDER — OXYTOCIN BOLUS FROM INFUSION: 333.0000 mL | Freq: Once | INTRAVENOUS | Status: DC

## 2020-12-24 MED ORDER — OXYCODONE-ACETAMINOPHEN 5-325 MG PO TABS: 1.0000 | ORAL_TABLET | ORAL | Status: DC | PRN

## 2020-12-24 MED ORDER — SOD CITRATE-CITRIC ACID 500-334 MG/5ML PO SOLN: 30.0000 mL | ORAL | Status: DC | PRN

## 2020-12-24 MED ORDER — COCONUT OIL OIL: 1.0000 "application " | TOPICAL_OIL | Status: DC | PRN

## 2020-12-24 MED ORDER — OXYTOCIN-SODIUM CHLORIDE 30-0.9 UT/500ML-% IV SOLN: 2.5000 [IU]/h | INTRAVENOUS | Status: DC

## 2020-12-24 MED ORDER — OXYTOCIN 10 UNIT/ML IJ SOLN
INTRAMUSCULAR | Status: AC
  Administered 2020-12-24: 10 [IU]
  Filled 2020-12-24: qty 2

## 2020-12-24 MED ORDER — ONDANSETRON HCL 4 MG/2ML IJ SOLN: 4.0000 mg | INTRAMUSCULAR | Status: DC | PRN

## 2020-12-24 MED ORDER — ONDANSETRON HCL 4 MG PO TABS: 4.0000 mg | ORAL_TABLET | ORAL | Status: DC | PRN

## 2020-12-24 MED ORDER — WITCH HAZEL-GLYCERIN EX PADS: 1.0000 "application " | MEDICATED_PAD | CUTANEOUS | Status: DC | PRN

## 2020-12-24 MED ORDER — MISOPROSTOL 200 MCG PO TABS
ORAL_TABLET | ORAL | Status: AC
  Filled 2020-12-24: qty 4

## 2020-12-24 MED ORDER — DIBUCAINE (PERIANAL) 1 % EX OINT: 1.0000 "application " | TOPICAL_OINTMENT | CUTANEOUS | Status: DC | PRN

## 2020-12-24 MED ORDER — ACETAMINOPHEN 325 MG PO TABS
650.0000 mg | ORAL_TABLET | ORAL | Status: DC | PRN
  Administered 2020-12-24 (×2): 650 mg via ORAL
  Filled 2020-12-24 (×2): qty 2

## 2020-12-24 MED ORDER — DIPHENHYDRAMINE HCL 25 MG PO CAPS
25.0000 mg | ORAL_CAPSULE | Freq: Four times a day (QID) | ORAL | Status: DC | PRN
  Administered 2020-12-25 – 2020-12-26 (×5): 25 mg via ORAL
  Filled 2020-12-24 (×5): qty 1

## 2020-12-24 MED ORDER — STERILE WATER FOR INJECTION IJ SOLN
INTRAMUSCULAR | Status: AC
  Filled 2020-12-24: qty 10

## 2020-12-24 MED ORDER — SODIUM CHLORIDE 0.9% FLUSH: 3.0000 mL | Freq: Two times a day (BID) | INTRAVENOUS | Status: DC

## 2020-12-24 MED ORDER — ZOLPIDEM TARTRATE 5 MG PO TABS: 5.0000 mg | ORAL_TABLET | Freq: Every evening | ORAL | Status: DC | PRN

## 2020-12-24 MED ORDER — FERROUS SULFATE 325 (65 FE) MG PO TABS
325.0000 mg | ORAL_TABLET | Freq: Two times a day (BID) | ORAL | Status: DC
  Administered 2020-12-24 – 2020-12-26 (×5): 325 mg via ORAL
  Filled 2020-12-24 (×5): qty 1

## 2020-12-24 NOTE — Transfer of Care (Signed)
Immediate Anesthesia Transfer of Care Note  Patient: Cindy Brock  Procedure(s) Performed: AN AD HOC CRNA MONITORING  Patient Location: Mother/Baby  Anesthesia Type:MAC  Level of Consciousness: awake, alert  and oriented  Airway & Oxygen Therapy: Patient Spontanous Breathing  Post-op Assessment: Report given to RN and Post -op Vital signs reviewed and stable  Post vital signs: Reviewed and stable  Last Vitals:  Vitals Value Taken Time  BP    Temp    Pulse    Resp    SpO2      Last Pain:  Vitals:   12/24/20 0728  TempSrc: Oral  PainSc: 4          Complications: No notable events documented.

## 2020-12-24 NOTE — OB Triage Note (Signed)
[facility] with complaints of ctx and LOF. Pt is a scheduled twin IOL for 0800. Pt denies vaginal bleeding. Pt states positive FM. Pt stated water broke around 0510 this morning. Monitors applied and assessing.

## 2020-12-24 NOTE — H&P (Signed)
Obstetric History and Physical  Cindy Brock is a 25 y.o. [id number] with IUP at [redacted]w[redacted]d presenting for preterm premature rupture of membranes. She currently has a diamniotic dichorionic pregnancy, with Twin A noting growth restriction (2nd %ile on 12/13/2020 scan, growth discordance 15%). Patient states she has been having  irregular contractions, none vaginal bleeding, ruptured, clear fluid membranes, with active fetal movement x 2.    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) Pregnancy complications or risks: Patient Active Problem List   Diagnosis Date Noted   Preterm premature rupture of membranes (PPROM) with onset of labor within 24 hours of rupture in third trimester, antepartum 12/24/2020   Preterm premature rupture of membranes (PPROM) with onset of labor after 24 hours of rupture in third trimester, antepartum 12/24/2020   Dichorionic diamniotic twin pregnancy in third trimester    Leg swelling in pregnancy in third trimester    Indication for care in labor and delivery, antepartum 12/14/2020   Anemia affecting pregnancy 10/26/2020   Pregnancy related hip pain, antepartum, second trimester 09/24/2020   Short interval between pregnancies affecting pregnancy in first trimester, antepartum 07/12/2020   Back pain affecting pregnancy in third trimester 10/03/2019   Immunization consent not given 10/03/2019   Rubella non-immune status, antepartum 09/12/2019   Maternal varicella, non-immune 09/12/2019   Hyperthyroidism affecting pregnancy in second trimester 06/14/2019   History of asthma 06/14/2019   She plans to breastfeed She desires  unsure method  for postpartum contraception.   Prenatal labs and studies: ABO, Rh: --/--/O POS (07/25 0719) Antibody: NEG (07/25 0719) Rubella:  Nonimmune (reviewed from previous pregnancy labs) RPR: Non Reactive (05/26 1125)  HBsAg:   (Negative, reviewed from previous pregnancy labs) HIV:   Pending  GBS: unknown 1 hr Glucola  normal Genetic screening normal Anatomy US normal x 2  Past Medical History:  Diagnosis Date   Asthma    Hyperthyroidism    Thyroid disease     Past Surgical History:  Procedure Laterality Date   NO PAST SURGERIES      OB History  Gravida Para Term Preterm AB Living  5 3 2 1 2 4   SAB IAB Ectopic Multiple Live Births  2     1 4     # Outcome Date GA Lbr Len/2nd Weight Sex Delivery Anes PTL Lv  5A Preterm 12/24/20 [redacted]w[redacted]d / 00:03 2120 g M Vag-Spont None  LIV  5B Preterm 12/24/20 [redacted]w[redacted]d / 00:24  M Vag-Breech None  LIV  4 Term 11/23/19 [redacted]w[redacted]d   F Vag-Spont  Y LIV  3 SAB 2015          2 Term 02/24/13   2268 g  Vag-Spont  N LIV  1 SAB 2014            Social History   Socioeconomic History   Marital status: Married    Spouse name: Chevaughn   Number of children: Not on file   Years of education: Not on file   Highest education level: Not on file  Occupational History   Not on file  Tobacco Use   Smoking status: Never   Smokeless tobacco: Never  Vaping Use   Vaping Use: Never used  Substance and Sexual Activity   Alcohol use: Not Currently    Comment: social    Drug use: Not Currently    Types: Marijuana   Sexual activity: Yes    Birth control/protection: None  Comment: undecided  Other Topics Concern   Not on file  Social History Narrative   Not on file   Social Determinants of Health   Financial Resource Strain: Not on file  Food Insecurity: Not on file  Transportation Needs: Not on file  Physical Activity: Not on file  Stress: Not on file  Social Connections: Not on file    Family History  Problem Relation Age of Onset   Rheum arthritis Mother    Hypothyroidism Maternal Grandmother     Medications Prior to Admission  Medication Sig Dispense Refill Last Dose   Prenatal MV-Min-Fe Fum-FA-DHA (PRENATAL 1 PO) Take by mouth.   12/23/2020   acetaminophen (TYLENOL) 500 MG tablet Take 2 tablets (1,000 mg total) by  mouth every 6 (six) hours as needed for fever or headache. 30 tablet 0    cyclobenzaprine (FLEXERIL) 10 MG tablet Take 1 tablet (10 mg total) by mouth 3 (three) times daily as needed for muscle spasms. 30 tablet 0    lidocaine (LIDODERM) 5 % Place 1 patch onto the skin daily. Remove & Discard patch within 12 hours or as directed by MD 30 patch 0     No Known Allergies  Review of Systems: Negative except for what is mentioned in HPI.  Physical Exam: BP 96/67   Pulse 97   Temp 98.4 F (36.9 C) (Oral)   Resp 17   Ht 5\' 6"  (1.676 m)   Wt 78 kg   LMP 04/11/2020   Breastfeeding Unknown   BMI 27.76 kg/m  CONSTITUTIONAL: Well-developed, well-nourished female in no acute distress.  HENT:  Normocephalic, atraumatic, External right and left ear normal. Oropharynx is clear and moist EYES: Conjunctivae and EOM are normal. Pupils are equal, round, and reactive to light. No scleral icterus.  NECK: Normal range of motion, supple, no masses SKIN: Skin is warm and dry. No rash noted. Not diaphoretic. No erythema. No pallor. NEUROLOGIC: Alert and oriented to person, place, and time. Normal reflexes, muscle tone coordination. No cranial nerve deficit noted. PSYCHIATRIC: Normal mood and affect. Normal behavior. Normal judgment and thought content. CARDIOVASCULAR: Normal heart rate noted, regular rhythm RESPIRATORY: Effort and breath sounds normal, no problems with respiration noted ABDOMEN: Soft, nontender, nondistended, gravid. MUSCULOSKELETAL: Normal range of motion. No edema and no tenderness. 2+ distal pulses.  Cervical Exam: Dilatation 2 cm   Effacement 60%   Station -2   Presentation: cephalic (Twin A), breech (Twin B) confirmed by ultrasound at bedside   Twin A:  FHT:  Baseline rate 140 bpm  Variability moderate   Accelerations present   Decelerations none  Twin B:  FHT:  Baseline rate 140 bpm  Variability moderate   Accelerations present   Decelerations none  Contractions: Every 2-6  mins   Pertinent Labs/Studies:   Labs pending   Imaging:  [place] MFM UA DOPPLER ADDL GEST RE EVAL ----------------------------------------------------------------------  OBSTETRICS REPORT                       (Signed Final 12/13/2020 06:18 pm) ---------------------------------------------------------------------- Patient Info  ID #:       [id number]                          D.O.B.:  1996-01-02 (24 yrs)  Name:       [facility]                    Visit Date:  12/13/2020 10:55 am ---------------------------------------------------------------------- Performed By  Attending:        Noralee Space MD        Ref. Address:     [facility]                                                             [facility]                                                             [address]                                                             [address]                                                             [place]                                                             [address]  Performed By:     Fayne Norrie BS,      [facility]                    RDMS, RVT                                [facility]  Referred By:      Gean Maidens                    LAWHORN ---------------------------------------------------------------------- Orders  #  Description                           Code        Ordered By  1  [place] MFM FETAL BPP                      [address].6     RAVI SHANKAR     W/NONSTRESS  2  [place] MFM UA CORD DOPPLER                76820.02    RAVI SHANKAR  3  [place] MFM OB FOLLOW UP                   76816.01    RAVI SHANKAR  4  [place] MFM FETAL BPP                      04540.9     RAVI SHANKAR     W/NONSTRESS ADD'L GEST  5  [place] MFM OB FOLLOW UP ADDL               81191.47    RAVI SHANKAR     GEST  6  [place] MFM UA DOPPLER ADDL                76820.03    RAVI SHANKAR     GEST RE EVAL ----------------------------------------------------------------------  #  Order #                     Accession #                Episode #  1  [id number]                   [id number]                 [id number]  2  [id number]                   [id number]                 [id number]  3  [id number]                   [id number]                 [id number]  4  [id number]                   [id number]                 [id number]  5  [id number]                   [id number]                 [id number]  6  [id number]                   [id number]                 [id number] ---------------------------------------------------------------------- Indications  Maternal care for known or suspected poor      O36.5911  fetal growth, first trimester, fetus 1 IUGR  (Baby A)  Twin  pregnancy, di/di, second trimester        O30.042  Anemia during pregnancy in second trimester    O99.012  Short interval between pregancies, 2nd         O09.892  trimester  [redacted] weeks gestation of pregnancy                Z3A.34 ---------------------------------------------------------------------- Fetal Evaluation (Fetus A)  Num Of Fetuses:         2  Fetal Heart Rate(bpm):  137  Cardiac Activity:       Observed  Fetal Lie:              Maternal right side  Presentation:           Cephalic  Placenta:               Anterior  P. Cord Insertion:      Previously Visualized  Membrane Desc:      Dividing Membrane seen - Dichorionic.  Amniotic Fluid  AFI FV:      Within normal limits                              Largest Pocket(cm)                              4.44 ---------------------------------------------------------------------- Biophysical Evaluation (Fetus A)  Amniotic F.V:   Pocket => 2 cm             F. Tone:        Observed  F. Movement:    Observed                   N.S.T:          Reactive  F. Breathing:   Observed                    Score:          10/10 ---------------------------------------------------------------------- Biometry (Fetus A)  BPD:      81.1  mm     G. Age:  32w 4d          7  %    CI:         75.4   %    70 - 86                                                          FL/HC:      22.0   %    19.4 - 21.8  HC:      296.2  mm     G. Age:  32w 5d        1.3  %    HC/AC:      1.14        0.96 - 1.11  AC:      260.2  mm     G. Age:  30w 1d        < 1  %    FL/BPD:     80.4   %    71 - 87  FL:       65.2  mm     G. Age:  33w 4d  21  %    FL/AC:      25.1   %    20 - 24  Est. FW:    1823  gm           4 lb    1.9  %     FW Discordancy        15  % ---------------------------------------------------------------------- Gestational Age (Fetus A)  LMP:           35w 1d        Date:  04/11/20                 EDD:   01/16/21  U/S Today:     32w 2d                                        EDD:   02/05/21  Best:          34w 3d     Det. ByMarcella Dubs         EDD:   01/21/21                                      (07/04/20) ---------------------------------------------------------------------- Anatomy (Fetus A)  Cranium:               Appears normal         LVOT:                   Previously seen  Cavum:                 Appears normal         Aortic Arch:            Previously seen  Ventricles:            Appears normal         Ductal Arch:            Previously seen  Choroid Plexus:        Previously seen        Diaphragm:              Appears normal  Cerebellum:            Previously seen        Stomach:                Appears normal, left                                                                        sided  Posterior Fossa:       Previously seen        Abdomen:                Previously seen  Nuchal Fold:           )                      Abdominal Wall:         Appears  nml (cord                                                                        insert, abd wall)  Face:                   Orbits and profile     Cord Vessels:           Previously seen                         previously seen  Lips:                  Previously seen        Kidneys:                Appear normal  Palate:                Previously seen        Bladder:                Appears normal  Thoracic:              Appears normal         Spine:                  Previously seen  Heart:                 Appears normal         Upper Extremities:      Previously seen                         (4CH, axis, and                         situs)  RVOT:                  Previously seen        Lower Extremities:      Previously seen  Other:  Female gender previously seen. Right Heel visualized previously .          Technically difficult due to fetal position. ---------------------------------------------------------------------- Doppler - Fetal Vessels (Fetus A)  Umbilical Artery   S/D     %tile      RI    %tile                             ADFV    RDFV   2.58       55    0.61       60                                No      No ---------------------------------------------------------------------- Fetal Evaluation (Fetus B)  Num Of Fetuses:         2  Fetal Heart Rate(bpm):  136  Cardiac Activity:       Observed  Fetal Lie:  Maternal left side  Presentation:           Cephalic  Placenta:               Posterior  P. Cord Insertion:      Previously Visualized  Membrane Desc:      Dividing Membrane seen - Dichorionic.  Amniotic Fluid  AFI FV:      Within normal limits                              Largest Pocket(cm)                              2.86 ---------------------------------------------------------------------- Biophysical Evaluation (Fetus B)  Amniotic F.V:   Pocket => 2 cm             F. Tone:        Observed  F. Movement:    Observed                   N.S.T:          Reactive  F. Breathing:   Observed                   Score:           10/10 ---------------------------------------------------------------------- Biometry (Fetus B)  BPD:      80.3  mm     G. Age:  32w 2d        4.2  %    CI:        72.69   %    70 - 86                                                          FL/HC:      21.8   %    19.4 - 21.8  HC:      299.5  mm     G. Age:  33w 1d          3  %    HC/AC:      1.03        0.96 - 1.11  AC:      290.2  mm     G. Age:  33w 0d         17  %    FL/BPD:     81.4   %    71 - 87  FL:       65.4  mm     G. Age:  33w 5d         23  %    FL/AC:      22.5   %    20 - 24  Est. FW:    2134  gm    4 lb 11 oz      14  %     FW Discordancy     0 \ 15 % ---------------------------------------------------------------------- Gestational Age (Fetus B)  LMP:           35w 1d        Date:  04/11/20                 EDD:  01/16/21  U/S Today:     33w 0d                                        EDD:   01/31/21  Best:          34w 3d     Det. ByMarcella Dubs         EDD:   01/21/21                                      (07/04/20) ---------------------------------------------------------------------- Anatomy (Fetus B)  Cranium:               Appears normal         Aortic Arch:            Previously seen  Cavum:                 Appears normal         Ductal Arch:            Previously seen  Ventricles:            Appears normal         Diaphragm:              Appears normal  Choroid Plexus:        Previously seen        Stomach:                Appears normal, left                                                                        sided  Cerebellum:            Appears normal         Abdomen:                Appears normal  Posterior Fossa:       Previously seen        Abdominal Wall:         Previously seen  Face:                  Not well visualized    Cord Vessels:           Previously seen  Lips:                  Not well visualized    Kidneys:                Appear normal  Palate:                Not well visualized     Bladder:                Appears normal  Thoracic:              Appears normal         Spine:  Previously seen  Heart:                 Previously seen        Upper Extremities:      Previously seen  RVOT:                  Not well visualized    Lower Extremities:      Previously seen  LVOT:                  Appears normal  Other:  Female gender previously seen. 3VV  and 3VT normal previously. ---------------------------------------------------------------------- Doppler - Fetal Vessels (Fetus B)  Umbilical Artery   S/D     %tile      RI    %tile                             ADFV    RDFV   2.44       46    0.59       52                                No      No ---------------------------------------------------------------------- Impression  Dichorionic-diamniotic twin pregnancy with selective severe  fetal growth restriction in twin A.  Twin A: Maternal right, cephalic presentation, anterior  placenta, female fetus.  Amniotic fluid is normal and good fetal  activity seen.  The estimated fetal weight is at the 2nd  percentile.  Interval weight gain is 481 g over 3 weeks.  Abdominal circumference measurement is at the 1st  percentile and head circumference measurement is between -  2 and -1 SD (no evidence of microcephaly).  Antenatal testing is reassuring.  Umbilical artery Doppler  showed normal forward diastolic flow.  NST is reactive.  BPP  10/10.  Twin B: Maternal left, cephalic presentation, posterior  placenta, female fetus.  Amniotic fluid is normal and good fetal  activity seen.  Fetal growth is appropriate for gestational age.  Umbilical artery Doppler showed normal forward diastolic  flow.  Antenatal testing is reassuring.  NST is reactive.  BPP  10/10.  Growth discordancy: 15%.  Patient will be undergoing induction of labor at [redacted] weeks  gestation. ---------------------------------------------------------------------- Recommendations  -NST at your office next  week.  -Delivery at [redacted] weeks gestation. ----------------------------------------------------------------------                  Noralee Space, MD Electronically Signed Final Report   12/13/2020 06:18 pm ---------------------------------------------------------------------- [place] MFM OB FOLLOW UP ADDL GEST ----------------------------------------------------------------------  OBSTETRICS REPORT                       (Signed Final 12/13/2020 06:18 pm) ---------------------------------------------------------------------- Patient Info  ID #:       [id number]                          D.O.B.:  1995/08/28 (24 yrs)  Name:       [facility]                    Visit Date: 12/13/2020 10:55 am ---------------------------------------------------------------------- Performed By  Attending:        Noralee Space MD        Ref. Address:     [facility]                                                             [address]                                                             [address]                                                             [place]                                                             [address]  Performed By:     Fayne Norrie BS,      Location:         [facility]                    RDMS, RVT                                [facility]  Referred By:      Gean Maidens                    LAWHORN ---------------------------------------------------------------------- Orders  #  Description                           Code        Ordered By  1  [place] MFM FETAL BPP  [address].6     RAVI SHANKAR     W/NONSTRESS  2  [place] MFM UA CORD DOPPLER                76820.02    RAVI SHANKAR  3   [place] MFM OB FOLLOW UP                   76816.01    RAVI SHANKAR  4  [place] MFM FETAL BPP                      04540.9     RAVI SHANKAR     W/NONSTRESS ADD'L GEST  5  [place] MFM OB FOLLOW UP ADDL              [id number]    RAVI SHANKAR     GEST  6  [place] MFM UA DOPPLER ADDL                76820.03    RAVI SHANKAR     GEST RE EVAL ----------------------------------------------------------------------  #  Order #                     Accession #                Episode #  1  [id number]                   [id number]                 [id number]  2  [id number]                   [id number]                 [id number]  3  [id number]                   [id number]                 [id number]  4  [id number]                   [id number]                 [id number]  5  [id number]                   [id number]                 [id number]  6  [id number]                   [id number]                 [id number] ---------------------------------------------------------------------- Indications  Maternal care for known or suspected poor      O36.5911  fetal growth, first trimester, fetus 1 IUGR  (Baby A)  Twin pregnancy, di/di, second trimester        O30.042  Anemia during pregnancy in second trimester    O99.012  Short interval between pregancies, 2nd         O09.892  trimester  [redacted] weeks gestation of pregnancy                Z3A.34 ---------------------------------------------------------------------- Fetal Evaluation (Fetus A)  Num Of Fetuses:         2  Fetal Heart Rate(bpm):  137  Cardiac Activity:       Observed  Fetal Lie:  Maternal right side  Presentation:           Cephalic  Placenta:               Anterior  P. Cord Insertion:      Previously Visualized  Membrane Desc:      Dividing Membrane seen - Dichorionic.  Amniotic Fluid  AFI FV:      Within normal limits                              Largest Pocket(cm)                               4.44 ---------------------------------------------------------------------- Biophysical Evaluation (Fetus A)  Amniotic F.V:   Pocket => 2 cm             F. Tone:        Observed  F. Movement:    Observed                   N.S.T:          Reactive  F. Breathing:   Observed                   Score:          10/10 ---------------------------------------------------------------------- Biometry (Fetus A)  BPD:      81.1  mm     G. Age:  32w 4d          7  %    CI:         75.4   %    70 - 86                                                          FL/HC:      22.0   %    19.4 - 21.8  HC:      296.2  mm     G. Age:  32w 5d        1.3  %    HC/AC:      1.14        0.96 - 1.11  AC:      260.2  mm     G. Age:  30w 1d        < 1  %    FL/BPD:     80.4   %    71 - 87  FL:       65.2  mm     G. Age:  33w 4d         21  %    FL/AC:      25.1   %    20 - 24  Est. FW:    1823  gm           4 lb    1.9  %     FW Discordancy        15  % ---------------------------------------------------------------------- Gestational Age (Fetus A)  LMP:           35w 1d        Date:  04/11/20  EDD:   01/16/21  U/S Today:     32w 2d                                        EDD:   02/05/21  Best:          34w 3d     Det. ByMarcella Dubs         EDD:   01/21/21                                      (07/04/20) ---------------------------------------------------------------------- Anatomy (Fetus A)  Cranium:               Appears normal         LVOT:                   Previously seen  Cavum:                 Appears normal         Aortic Arch:            Previously seen  Ventricles:            Appears normal         Ductal Arch:            Previously seen  Choroid Plexus:        Previously seen        Diaphragm:              Appears normal  Cerebellum:            Previously seen        Stomach:                Appears normal, left                                                                        sided   Posterior Fossa:       Previously seen        Abdomen:                Previously seen  Nuchal Fold:           )                      Abdominal Wall:         Appears nml (cord                                                                        insert, abd wall)  Face:                  Orbits and profile     Cord Vessels:  Previously seen                         previously seen  Lips:                  Previously seen        Kidneys:                Appear normal  Palate:                Previously seen        Bladder:                Appears normal  Thoracic:              Appears normal         Spine:                  Previously seen  Heart:                 Appears normal         Upper Extremities:      Previously seen                         (4CH, axis, and                         situs)  RVOT:                  Previously seen        Lower Extremities:      Previously seen  Other:  Female gender previously seen. Right Heel visualized previously .          Technically difficult due to fetal position. ---------------------------------------------------------------------- Doppler - Fetal Vessels (Fetus A)  Umbilical Artery   S/D     %tile      RI    %tile                             ADFV    RDFV   2.58       55    0.61       60                                No      No ---------------------------------------------------------------------- Fetal Evaluation (Fetus B)  Num Of Fetuses:         2  Fetal Heart Rate(bpm):  136  Cardiac Activity:       Observed  Fetal Lie:              Maternal left side  Presentation:           Cephalic  Placenta:               Posterior  P. Cord Insertion:      Previously Visualized  Membrane Desc:      Dividing Membrane seen - Dichorionic.  Amniotic Fluid  AFI FV:      Within normal limits                              Largest Pocket(cm)  2.86 ---------------------------------------------------------------------- Biophysical  Evaluation (Fetus B)  Amniotic F.V:   Pocket => 2 cm             F. Tone:        Observed  F. Movement:    Observed                   N.S.T:          Reactive  F. Breathing:   Observed                   Score:          10/10 ---------------------------------------------------------------------- Biometry (Fetus B)  BPD:      80.3  mm     G. Age:  32w 2d        4.2  %    CI:        72.69   %    70 - 86                                                          FL/HC:      21.8   %    19.4 - 21.8  HC:      299.5  mm     G. Age:  33w 1d          3  %    HC/AC:      1.03        0.96 - 1.11  AC:      290.2  mm     G. Age:  33w 0d         17  %    FL/BPD:     81.4   %    71 - 87  FL:       65.4  mm     G. Age:  33w 5d         23  %    FL/AC:      22.5   %    20 - 24  Est. FW:    2134  gm    4 lb 11 oz      14  %     FW Discordancy     0 \ 15 % ---------------------------------------------------------------------- Gestational Age (Fetus B)  LMP:           35w 1d        Date:  04/11/20                 EDD:   01/16/21  U/S Today:     33w 0d                                        EDD:   01/31/21  Best:          34w 3d     Det. ByMarcella Dubs         EDD:   01/21/21                                      (07/04/20) ---------------------------------------------------------------------- Anatomy (Fetus B)  Cranium:               Appears normal         Aortic Arch:            Previously seen  Cavum:                 Appears normal         Ductal Arch:            Previously seen  Ventricles:            Appears normal         Diaphragm:              Appears normal  Choroid Plexus:        Previously seen        Stomach:                Appears normal, left                                                                        sided  Cerebellum:            Appears normal         Abdomen:                Appears normal  Posterior Fossa:       Previously seen        Abdominal Wall:         Previously seen   Face:                  Not well visualized    Cord Vessels:           Previously seen  Lips:                  Not well visualized    Kidneys:                Appear normal  Palate:                Not well visualized    Bladder:                Appears normal  Thoracic:              Appears normal         Spine:                  Previously seen  Heart:                 Previously seen        Upper Extremities:      Previously seen  RVOT:                  Not well visualized    Lower Extremities:      Previously seen  LVOT:                  Appears normal  Other:  Female gender previously seen. 3VV  and 3VT normal previously. ---------------------------------------------------------------------- Doppler - Fetal Vessels (Fetus B)  Umbilical Artery   S/D     %tile      RI    %  tile                             ADFV    RDFV   2.44       46    0.59       52                                No      No ---------------------------------------------------------------------- Impression  Dichorionic-diamniotic twin pregnancy with selective severe  fetal growth restriction in twin A.  Twin A: Maternal right, cephalic presentation, anterior  placenta, female fetus.  Amniotic fluid is normal and good fetal  activity seen.  The estimated fetal weight is at the 2nd  percentile.  Interval weight gain is 481 g over 3 weeks.  Abdominal circumference measurement is at the 1st  percentile and head circumference measurement is between -  2 and -1 SD (no evidence of microcephaly).  Antenatal testing is reassuring.  Umbilical artery Doppler  showed normal forward diastolic flow.  NST is reactive.  BPP  10/10.  Twin B: Maternal left, cephalic presentation, posterior  placenta, female fetus.  Amniotic fluid is normal and good fetal  activity seen.  Fetal growth is appropriate for gestational age.  Umbilical artery Doppler showed normal forward diastolic  flow.  Antenatal testing is reassuring.  NST is reactive.  BPP   10/10.  Growth discordancy: 15%.  Patient will be undergoing induction of labor at [redacted] weeks  gestation. ---------------------------------------------------------------------- Recommendations  -NST at your office next week.  -Delivery at [redacted] weeks gestation. ----------------------------------------------------------------------                  Noralee Space, MD Electronically Signed Final Report   12/13/2020 06:18 pm ---------------------------------------------------------------------- [place] MFM FETAL BPP W/NONSTRESS ADD'L GEST ----------------------------------------------------------------------  OBSTETRICS REPORT                       (Signed Final 12/13/2020 06:18 pm) ---------------------------------------------------------------------- Patient Info  ID #:       [id number]                          D.O.B.:  Nov 30, 1995 (24 yrs)  Name:       [facility] [place]                    Visit Date: 12/13/2020 10:55 am ---------------------------------------------------------------------- Performed By  Attending:        Noralee Space MD        Ref. Address:     [facility]                                                             [address]                                                             [address]                                                             [place]                                                             [address]  Performed By:     Fayne Norrie BS,      Location:         [facility]                    RDMS, RVT                                [facility]  Referred By:      Gean Maidens                     LAWHORN ---------------------------------------------------------------------- Orders  #  Description                           Code        Ordered By  1  [place] MFM FETAL BPP                      60454.0     RAVI [facility]     W/NONSTRESS  2  [place] MFM UA CORD DOPPLER                76820.02    RAVI SHANKAR  3  [place] MFM OB FOLLOW UP                   76816.01    RAVI SHANKAR  4  [place] MFM FETAL BPP                      98119.1     RAVI [facility]  W/NONSTRESS ADD'L GEST  5  [place] MFM OB FOLLOW UP ADDL              [id number]    RAVI SHANKAR     GEST  6  [place] MFM UA DOPPLER ADDL                76820.03    RAVI SHANKAR     GEST RE EVAL ----------------------------------------------------------------------  #  Order #                     Accession #                Episode #  1  [id number]                   [id number]                 [id number]  2  [id number]                   [id number]                 [id number]  3  [id number]                   [id number]                 [id number]  4  [id number]                   [id number]                 [id number]  5  [id number]                   [id number]                 [id number]  6  [id number]                   [id number]                 [id number] ---------------------------------------------------------------------- Indications  Maternal care for known or suspected poor      O36.5911  fetal growth, first trimester, fetus 1 IUGR  (Baby A)  Twin pregnancy, di/di, second trimester        O30.042  Anemia during pregnancy in second trimester    O99.012  Short interval between pregancies, 2nd         O09.892  trimester  [redacted] weeks gestation of pregnancy                Z3A.34 ---------------------------------------------------------------------- Fetal Evaluation (Fetus A)  Num Of Fetuses:         2  Fetal Heart Rate(bpm):  137  Cardiac Activity:       Observed  Fetal Lie:              Maternal right side  Presentation:           Cephalic  Placenta:               Anterior  P.  Cord Insertion:      Previously Visualized  Membrane Desc:      Dividing Membrane seen - Dichorionic.  Amniotic Fluid  AFI FV:      Within normal limits                              Largest  Pocket(cm)                              4.44 ---------------------------------------------------------------------- Biophysical Evaluation (Fetus A)  Amniotic F.V:   Pocket => 2 cm             F. Tone:        Observed  F. Movement:    Observed                   N.S.T:          Reactive  F. Breathing:   Observed                   Score:          10/10 ---------------------------------------------------------------------- Biometry (Fetus A)  BPD:      81.1  mm     G. Age:  32w 4d          7  %    CI:         75.4   %    70 - 86                                                          FL/HC:      22.0   %    19.4 - 21.8  HC:      296.2  mm     G. Age:  32w 5d        1.3  %    HC/AC:      1.14        0.96 - 1.11  AC:      260.2  mm     G. Age:  30w 1d        < 1  %    FL/BPD:     80.4   %    71 - 87  FL:       65.2  mm     G. Age:  33w 4d         21  %    FL/AC:      25.1   %    20 - 24  Est. FW:    1823  gm           4 lb    1.9  %     FW Discordancy        15  % ---------------------------------------------------------------------- Gestational Age (Fetus A)  LMP:           35w 1d        Date:  04/11/20                 EDD:   01/16/21  U/S Today:     32w 2d                                        EDD:   02/05/21  Best:          34w 3d     Det. ByMarcella Dubs         EDD:   01/21/21                                      (  07/04/20) ---------------------------------------------------------------------- Anatomy (Fetus A)  Cranium:               Appears normal         LVOT:                   Previously seen  Cavum:                 Appears normal         Aortic Arch:            Previously seen  Ventricles:            Appears normal         Ductal Arch:            Previously seen  Choroid Plexus:         Previously seen        Diaphragm:              Appears normal  Cerebellum:            Previously seen        Stomach:                Appears normal, left                                                                        sided  Posterior Fossa:       Previously seen        Abdomen:                Previously seen  Nuchal Fold:           )                      Abdominal Wall:         Appears nml (cord                                                                        insert, abd wall)  Face:                  Orbits and profile     Cord Vessels:           Previously seen                         previously seen  Lips:                  Previously seen        Kidneys:                Appear normal  Palate:                Previously seen        Bladder:                Appears normal  Thoracic:  Appears normal         Spine:                  Previously seen  Heart:                 Appears normal         Upper Extremities:      Previously seen                         (4CH, axis, and                         situs)  RVOT:                  Previously seen        Lower Extremities:      Previously seen  Other:  Female gender previously seen. Right Heel visualized previously .          Technically difficult due to fetal position. ---------------------------------------------------------------------- Doppler - Fetal Vessels (Fetus A)  Umbilical Artery   S/D     %tile      RI    %tile                             ADFV    RDFV   2.58       55    0.61       60                                No      No ---------------------------------------------------------------------- Fetal Evaluation (Fetus B)  Num Of Fetuses:         2  Fetal Heart Rate(bpm):  136  Cardiac Activity:       Observed  Fetal Lie:              Maternal left side  Presentation:           Cephalic  Placenta:               Posterior  P. Cord Insertion:      Previously Visualized  Membrane Desc:      Dividing Membrane seen -  Dichorionic.  Amniotic Fluid  AFI FV:      Within normal limits                              Largest Pocket(cm)                              2.86 ---------------------------------------------------------------------- Biophysical Evaluation (Fetus B)  Amniotic F.V:   Pocket => 2 cm             F. Tone:        Observed  F. Movement:    Observed                   N.S.T:          Reactive  F. Breathing:   Observed                   Score:          10/10 ---------------------------------------------------------------------- Biometry (Fetus B)  BPD:  80.3  mm     G. Age:  32w 2d        4.2  %    CI:        72.69   %    70 - 86                                                          FL/HC:      21.8   %    19.4 - 21.8  HC:      299.5  mm     G. Age:  33w 1d          3  %    HC/AC:      1.03        0.96 - 1.11  AC:      290.2  mm     G. Age:  33w 0d         17  %    FL/BPD:     81.4   %    71 - 87  FL:       65.4  mm     G. Age:  33w 5d         23  %    FL/AC:      22.5   %    20 - 24  Est. FW:    2134  gm    4 lb 11 oz      14  %     FW Discordancy     0 \ 15 % ---------------------------------------------------------------------- Gestational Age (Fetus B)  LMP:           35w 1d        Date:  04/11/20                 EDD:   01/16/21  U/S Today:     33w 0d                                        EDD:   01/31/21  Best:          34w 3d     Det. By:  Marcella Dubs         EDD:   01/21/21                                      (07/04/20) ---------------------------------------------------------------------- Anatomy (Fetus B)  Cranium:               Appears normal         Aortic Arch:            Previously seen  Cavum:                 Appears normal         Ductal Arch:            Previously seen  Ventricles:            Appears normal         Diaphragm:  Appears normal  Choroid Plexus:        Previously seen        Stomach:                Appears normal, left                                                                         sided  Cerebellum:            Appears normal         Abdomen:                Appears normal  Posterior Fossa:       Previously seen        Abdominal Wall:         Previously seen  Face:                  Not well visualized    Cord Vessels:           Previously seen  Lips:                  Not well visualized    Kidneys:                Appear normal  Palate:                Not well visualized    Bladder:                Appears normal  Thoracic:              Appears normal         Spine:                  Previously seen  Heart:                 Previously seen        Upper Extremities:      Previously seen  RVOT:                  Not well visualized    Lower Extremities:      Previously seen  LVOT:                  Appears normal  Other:  Female gender previously seen. 3VV  and 3VT normal previously. ---------------------------------------------------------------------- Doppler - Fetal Vessels (Fetus B)  Umbilical Artery   S/D     %tile      RI    %tile                             ADFV    RDFV   2.44       46    0.59       52                                No      No ---------------------------------------------------------------------- Impression  Dichorionic-diamniotic twin pregnancy with selective severe  fetal growth restriction in twin A.  Twin A: Maternal right, cephalic presentation, anterior  placenta, female fetus.  Amniotic fluid is normal and good fetal  activity seen.  The estimated fetal weight is at the 2nd  percentile.  Interval weight gain is 481 g over 3 weeks.  Abdominal circumference measurement is at the 1st  percentile and head circumference measurement is between -  2 and -1 SD (no evidence of microcephaly).  Antenatal testing is reassuring.  Umbilical artery Doppler  showed normal forward diastolic flow.  NST is reactive.  BPP  10/10.  Twin B: Maternal left, cephalic presentation, posterior  placenta, female fetus.  Amniotic  fluid is normal and good fetal  activity seen.  Fetal growth is appropriate for gestational age.  Umbilical artery Doppler showed normal forward diastolic  flow.  Antenatal testing is reassuring.  NST is reactive.  BPP  10/10.  Growth discordancy: 15%.  Patient will be undergoing induction of labor at [redacted] weeks  gestation. ---------------------------------------------------------------------- Recommendations  -NST at your office next week.  -Delivery at [redacted] weeks gestation. ----------------------------------------------------------------------                  Noralee Space, MD Electronically Signed Final Report   12/13/2020 06:18 pm ---------------------------------------------------------------------- [place] MFM FETAL BPP W/NONSTRESS ----------------------------------------------------------------------  OBSTETRICS REPORT                       (Signed Final 12/13/2020 06:18 pm) ---------------------------------------------------------------------- Patient Info  ID #:       [id number]                          D.O.B.:  1996-02-05 (24 yrs)  Name:       [facility] [place]                    Visit Date: 12/13/2020 10:55 am ---------------------------------------------------------------------- Performed By  Attending:        Noralee Space MD        Ref. Address:     [facility]                                                             [address]                                                             [address]  [place]                                                             [address]  Performed By:     Fayne Norrie BS,      Location:         [facility]                    RDMS, RVT                                [facility]                                                              [facility]  Referred By:      Gean Maidens                    LAWHORN ---------------------------------------------------------------------- Orders  #  Description                           Code        Ordered By  1  [place] MFM FETAL BPP                      08657.8     RAVI [facility]     W/NONSTRESS  2  [place] MFM UA CORD DOPPLER                76820.02    RAVI SHANKAR  3  [place] MFM OB FOLLOW UP                   76816.01    RAVI SHANKAR  4  [place] MFM FETAL BPP                      46962.9     RAVI SHANKAR     W/NONSTRESS ADD'L GEST  5  [place] MFM OB FOLLOW UP ADDL              52841.32    RAVI SHANKAR     GEST  6  [place] MFM UA DOPPLER ADDL                44010.27    RAVI SHANKAR     GEST RE EVAL ----------------------------------------------------------------------  #  Order #                     Accession #                Episode #  1  [id number]                   [id number]                 [id number]  2  [id number]                   [id number]                 [id number]  3  [id number]                   [id number]                 [id number]  4  [id number]                   [id number]                 [id number]  5  [id number]                   [id number]                 [id number]  6  [id number]                   [id number]                 [id number] ---------------------------------------------------------------------- Indications  Maternal care for known or suspected poor      O36.5911  fetal growth, first trimester, fetus 1 IUGR  (Baby A)  Twin pregnancy, di/di, second trimester        O30.042  Anemia during pregnancy in second trimester    O99.012  Short interval between pregancies, 2nd         O09.892  trimester  [redacted] weeks gestation of pregnancy                Z3A.34 ---------------------------------------------------------------------- Fetal Evaluation (Fetus A)  Num Of Fetuses:         2  Fetal Heart Rate(bpm):  137  Cardiac Activity:        Observed  Fetal Lie:              Maternal right side  Presentation:           Cephalic  Placenta:               Anterior  P. Cord Insertion:      Previously Visualized  Membrane Desc:      Dividing Membrane seen - Dichorionic.  Amniotic Fluid  AFI FV:      Within normal limits                              Largest Pocket(cm)                              4.44 ---------------------------------------------------------------------- Biophysical Evaluation (Fetus A)  Amniotic F.V:   Pocket => 2 cm             F. Tone:        Observed  F. Movement:    Observed                   N.S.T:          Reactive  F. Breathing:   Observed  Score:          10/10 ---------------------------------------------------------------------- Biometry (Fetus A)  BPD:      81.1  mm     G. Age:  32w 4d          7  %    CI:         75.4   %    70 - 86                                                          FL/HC:      22.0   %    19.4 - 21.8  HC:      296.2  mm     G. Age:  32w 5d        1.3  %    HC/AC:      1.14        0.96 - 1.11  AC:      260.2  mm     G. Age:  30w 1d        < 1  %    FL/BPD:     80.4   %    71 - 87  FL:       65.2  mm     G. Age:  33w 4d         21  %    FL/AC:      25.1   %    20 - 24  Est. FW:    1823  gm           4 lb    1.9  %     FW Discordancy        15  % ---------------------------------------------------------------------- Gestational Age (Fetus A)  LMP:           35w 1d        Date:  04/11/20                 EDD:   01/16/21  U/S Today:     32w 2d                                        EDD:   02/05/21  Best:          34w 3d     Det. By:  Marcella Dubs         EDD:   01/21/21                                      (07/04/20) ---------------------------------------------------------------------- Anatomy (Fetus A)  Cranium:               Appears normal         LVOT:                   Previously seen  Cavum:                 Appears normal         Aortic Arch:             Previously seen  Ventricles:            Appears normal         Ductal Arch:            Previously seen  Choroid Plexus:        Previously seen        Diaphragm:              Appears normal  Cerebellum:            Previously seen        Stomach:                Appears normal, left                                                                        sided  Posterior Fossa:       Previously seen        Abdomen:                Previously seen  Nuchal Fold:           )                      Abdominal Wall:         Appears nml (cord                                                                        insert, abd wall)  Face:                  Orbits and profile     Cord Vessels:           Previously seen                         previously seen  Lips:                  Previously seen        Kidneys:                Appear normal  Palate:                Previously seen        Bladder:                Appears normal  Thoracic:              Appears normal         Spine:                  Previously seen  Heart:                 Appears normal         Upper Extremities:      Previously seen                         (  4CH, axis, and                         situs)  RVOT:                  Previously seen        Lower Extremities:      Previously seen  Other:  Female gender previously seen. Right Heel visualized previously .          Technically difficult due to fetal position. ---------------------------------------------------------------------- Doppler - Fetal Vessels (Fetus A)  Umbilical Artery   S/D     %tile      RI    %tile                             ADFV    RDFV   2.58       55    0.61       60                                No      No ---------------------------------------------------------------------- Fetal Evaluation (Fetus B)  Num Of Fetuses:         2  Fetal Heart Rate(bpm):  136  Cardiac Activity:       Observed  Fetal Lie:              Maternal left side  Presentation:            Cephalic  Placenta:               Posterior  P. Cord Insertion:      Previously Visualized  Membrane Desc:      Dividing Membrane seen - Dichorionic.  Amniotic Fluid  AFI FV:      Within normal limits                              Largest Pocket(cm)                              2.86 ---------------------------------------------------------------------- Biophysical Evaluation (Fetus B)  Amniotic F.V:   Pocket => 2 cm             F. Tone:        Observed  F. Movement:    Observed                   N.S.T:          Reactive  F. Breathing:   Observed                   Score:          10/10 ---------------------------------------------------------------------- Biometry (Fetus B)  BPD:      80.3  mm     G. Age:  32w 2d        4.2  %    CI:        72.69   %    70 - 86  FL/HC:      21.8   %    19.4 - 21.8  HC:      299.5  mm     G. Age:  33w 1d          3  %    HC/AC:      1.03        0.96 - 1.11  AC:      290.2  mm     G. Age:  33w 0d         17  %    FL/BPD:     81.4   %    71 - 87  FL:       65.4  mm     G. Age:  33w 5d         23  %    FL/AC:      22.5   %    20 - 24  Est. FW:    2134  gm    4 lb 11 oz      14  %     FW Discordancy     0 \ 15 % ---------------------------------------------------------------------- Gestational Age (Fetus B)  LMP:           35w 1d        Date:  04/11/20                 EDD:   01/16/21  U/S Today:     33w 0d                                        EDD:   01/31/21  Best:          34w 3d     Det. ByMarcella Dubs         EDD:   01/21/21                                      (07/04/20) ---------------------------------------------------------------------- Anatomy (Fetus B)  Cranium:               Appears normal         Aortic Arch:            Previously seen  Cavum:                 Appears normal         Ductal Arch:            Previously seen  Ventricles:            Appears normal         Diaphragm:               Appears normal  Choroid Plexus:        Previously seen        Stomach:                Appears normal, left  sided  Cerebellum:            Appears normal         Abdomen:                Appears normal  Posterior Fossa:       Previously seen        Abdominal Wall:         Previously seen  Face:                  Not well visualized    Cord Vessels:           Previously seen  Lips:                  Not well visualized    Kidneys:                Appear normal  Palate:                Not well visualized    Bladder:                Appears normal  Thoracic:              Appears normal         Spine:                  Previously seen  Heart:                 Previously seen        Upper Extremities:      Previously seen  RVOT:                  Not well visualized    Lower Extremities:      Previously seen  LVOT:                  Appears normal  Other:  Female gender previously seen. 3VV  and 3VT normal previously. ---------------------------------------------------------------------- Doppler - Fetal Vessels (Fetus B)  Umbilical Artery   S/D     %tile      RI    %tile                             ADFV    RDFV   2.44       46    0.59       52                                No      No ---------------------------------------------------------------------- Impression  Dichorionic-diamniotic twin pregnancy with selective severe  fetal growth restriction in twin A.  Twin A: Maternal right, cephalic presentation, anterior  placenta, female fetus.  Amniotic fluid is normal and good fetal  activity seen.  The estimated fetal weight is at the 2nd  percentile.  Interval weight gain is 481 g over 3 weeks.  Abdominal circumference measurement is at the 1st  percentile and head circumference measurement is between -  2 and -1 SD (no evidence of microcephaly).  Antenatal testing is reassuring.  Umbilical artery Doppler  showed normal forward  diastolic flow.  NST is reactive.  BPP  10/10.  Twin B: Maternal left, cephalic presentation, posterior  placenta, female fetus.  Amniotic fluid is normal and good fetal  activity seen.  Fetal growth  is appropriate for gestational age.  Umbilical artery Doppler showed normal forward diastolic  flow.  Antenatal testing is reassuring.  NST is reactive.  BPP  10/10.  Growth discordancy: 15%.  Patient will be undergoing induction of labor at [redacted] weeks  gestation. ---------------------------------------------------------------------- Recommendations  -NST at your office next week.  -Delivery at [redacted] weeks gestation. ----------------------------------------------------------------------                  Noralee Space, MD Electronically Signed Final Report   12/13/2020 06:18 pm ---------------------------------------------------------------------- [place] MFM OB FOLLOW UP ----------------------------------------------------------------------  OBSTETRICS REPORT                       (Signed Final 12/13/2020 06:18 pm) ---------------------------------------------------------------------- Patient Info  ID #:       [id number]                          D.O.B.:  September 25, 1995 (24 yrs)  Name:       [facility]                    Visit Date: 12/13/2020 10:55 am ---------------------------------------------------------------------- Performed By  Attending:        Noralee Space MD        Ref. Address:     [facility]                                                             [facility]                                                             [address]                                                             [address]                                                             [place]  [address]  Performed By:     Fayne Norrie BS,      Location:         [facility]                     RDMS, RVT                                [facility]  Referred By:      Gean Maidens                    LAWHORN ---------------------------------------------------------------------- Orders  #  Description                           Code        Ordered By  1  [place] MFM FETAL BPP                      70350.0     RAVI [facility]     W/NONSTRESS  2  [place] MFM UA CORD DOPPLER                76820.02    RAVI SHANKAR  3  [place] MFM OB FOLLOW UP                   76816.01    RAVI SHANKAR  4  [place] MFM FETAL BPP                      93818.2     RAVI [facility]     W/NONSTRESS ADD'L GEST  5  [place] MFM OB FOLLOW UP ADDL              [id number]    RAVI SHANKAR     GEST  6  [place] MFM UA DOPPLER ADDL                76820.03    RAVI SHANKAR     GEST RE EVAL ----------------------------------------------------------------------  #  Order #                     Accession #                Episode #  1  [id number]                   [id number]                 [id number]  2  [id number]                   [id number]  3  [id number]                   [id number]                 [id number]  4  [id number]                   [id number]                 [id number]  5  [id number]                   [id number]                 [id number]  6  [id number]                   [id number]                 [id number] ---------------------------------------------------------------------- Indications  Maternal care for known or suspected poor      O36.5911  fetal growth, first trimester, fetus 1 IUGR  (Baby A)  Twin pregnancy, di/di, second trimester        O30.042  Anemia during pregnancy in second trimester    O99.012  Short interval between pregancies, 2nd         O09.892  trimester  [redacted] weeks gestation of pregnancy                 Z3A.34 ---------------------------------------------------------------------- Fetal Evaluation (Fetus A)  Num Of Fetuses:         2  Fetal Heart Rate(bpm):  137  Cardiac Activity:       Observed  Fetal Lie:              Maternal right side  Presentation:           Cephalic  Placenta:               Anterior  P. Cord Insertion:      Previously Visualized  Membrane Desc:      Dividing Membrane seen - Dichorionic.  Amniotic Fluid  AFI FV:      Within normal limits                              Largest Pocket(cm)                              4.44 ---------------------------------------------------------------------- Biophysical Evaluation (Fetus A)  Amniotic F.V:   Pocket => 2 cm             F. Tone:        Observed  F. Movement:    Observed                   N.S.T:          Reactive  F. Breathing:   Observed                   Score:          10/10 ---------------------------------------------------------------------- Biometry (Fetus A)  BPD:      81.1  mm     G. Age:  32w 4d          7  %    CI:         75.4   %    70 - 86  FL/HC:      22.0   %    19.4 - 21.8  HC:      296.2  mm     G. Age:  32w 5d        1.3  %    HC/AC:      1.14        0.96 - 1.11  AC:      260.2  mm     G. Age:  30w 1d        < 1  %    FL/BPD:     80.4   %    71 - 87  FL:       65.2  mm     G. Age:  33w 4d         21  %    FL/AC:      25.1   %    20 - 24  Est. FW:    1823  gm           4 lb    1.9  %     FW Discordancy        15  % ---------------------------------------------------------------------- Gestational Age (Fetus A)  LMP:           35w 1d        Date:  04/11/20                 EDD:   01/16/21  U/S Today:     32w 2d                                        EDD:   02/05/21  Best:          34w 3d     Det. ByMarcella Dubs         EDD:   01/21/21                                       (07/04/20) ---------------------------------------------------------------------- Anatomy (Fetus A)  Cranium:               Appears normal         LVOT:                   Previously seen  Cavum:                 Appears normal         Aortic Arch:            Previously seen  Ventricles:            Appears normal         Ductal Arch:            Previously seen  Choroid Plexus:        Previously seen        Diaphragm:              Appears normal  Cerebellum:            Previously seen        Stomach:                Appears normal, left  sided  Posterior Fossa:       Previously seen        Abdomen:                Previously seen  Nuchal Fold:           )                      Abdominal Wall:         Appears nml (cord                                                                        insert, abd wall)  Face:                  Orbits and profile     Cord Vessels:           Previously seen                         previously seen  Lips:                  Previously seen        Kidneys:                Appear normal  Palate:                Previously seen        Bladder:                Appears normal  Thoracic:              Appears normal         Spine:                  Previously seen  Heart:                 Appears normal         Upper Extremities:      Previously seen                         (4CH, axis, and                         situs)  RVOT:                  Previously seen        Lower Extremities:      Previously seen  Other:  Female gender previously seen. Right Heel visualized previously .          Technically difficult due to fetal position. ---------------------------------------------------------------------- Doppler - Fetal Vessels (Fetus A)  Umbilical Artery   S/D     %tile      RI    %tile                             ADFV    RDFV   2.58       55    0.61       60  No       No ---------------------------------------------------------------------- Fetal Evaluation (Fetus B)  Num Of Fetuses:         2  Fetal Heart Rate(bpm):  136  Cardiac Activity:       Observed  Fetal Lie:              Maternal left side  Presentation:           Cephalic  Placenta:               Posterior  P. Cord Insertion:      Previously Visualized  Membrane Desc:      Dividing Membrane seen - Dichorionic.  Amniotic Fluid  AFI FV:      Within normal limits                              Largest Pocket(cm)                              2.86 ---------------------------------------------------------------------- Biophysical Evaluation (Fetus B)  Amniotic F.V:   Pocket => 2 cm             F. Tone:        Observed  F. Movement:    Observed                   N.S.T:          Reactive  F. Breathing:   Observed                   Score:          10/10 ---------------------------------------------------------------------- Biometry (Fetus B)  BPD:      80.3  mm     G. Age:  32w 2d        4.2  %    CI:        72.69   %    70 - 86                                                          FL/HC:      21.8   %    19.4 - 21.8  HC:      299.5  mm     G. Age:  33w 1d          3  %    HC/AC:      1.03        0.96 - 1.11  AC:      290.2  mm     G. Age:  33w 0d         17  %    FL/BPD:     81.4   %    71 - 87  FL:       65.4  mm     G. Age:  33w 5d         23  %    FL/AC:      22.5   %    20 - 24  Est. FW:    2134  gm    4 lb 11 oz      14  %  FW Discordancy     0 \ 15 % ---------------------------------------------------------------------- Gestational Age (Fetus B)  LMP:           35w 1d        Date:  04/11/20                 EDD:   01/16/21  U/S Today:     33w 0d                                        EDD:   01/31/21  Best:          34w 3d     Det. ByMarcella Dubs         EDD:   01/21/21                                       (07/04/20) ---------------------------------------------------------------------- Anatomy (Fetus B)  Cranium:               Appears normal         Aortic Arch:            Previously seen  Cavum:                 Appears normal         Ductal Arch:            Previously seen  Ventricles:            Appears normal         Diaphragm:              Appears normal  Choroid Plexus:        Previously seen        Stomach:                Appears normal, left                                                                        sided  Cerebellum:            Appears normal         Abdomen:                Appears normal  Posterior Fossa:       Previously seen        Abdominal Wall:         Previously seen  Face:                  Not well visualized    Cord Vessels:           Previously seen  Lips:                  Not well visualized    Kidneys:                Appear normal  Palate:                Not well visualized    Bladder:  Appears normal  Thoracic:              Appears normal         Spine:                  Previously seen  Heart:                 Previously seen        Upper Extremities:      Previously seen  RVOT:                  Not well visualized    Lower Extremities:      Previously seen  LVOT:                  Appears normal  Other:  Female gender previously seen. 3VV  and 3VT normal previously. ---------------------------------------------------------------------- Doppler - Fetal Vessels (Fetus B)  Umbilical Artery   S/D     %tile      RI    %tile                             ADFV    RDFV   2.44       46    0.59       52                                No      No ---------------------------------------------------------------------- Impression  Dichorionic-diamniotic twin pregnancy with selective severe  fetal growth restriction in twin A.  Twin A: Maternal right, cephalic presentation, anterior  placenta, female fetus.  Amniotic fluid is normal and good fetal  activity seen.   The estimated fetal weight is at the 2nd  percentile.  Interval weight gain is 481 g over 3 weeks.  Abdominal circumference measurement is at the 1st  percentile and head circumference measurement is between -  2 and -1 SD (no evidence of microcephaly).  Antenatal testing is reassuring.  Umbilical artery Doppler  showed normal forward diastolic flow.  NST is reactive.  BPP  10/10.  Twin B: Maternal left, cephalic presentation, posterior  placenta, female fetus.  Amniotic fluid is normal and good fetal  activity seen.  Fetal growth is appropriate for gestational age.  Umbilical artery Doppler showed normal forward diastolic  flow.  Antenatal testing is reassuring.  NST is reactive.  BPP  10/10.  Growth discordancy: 15%.  Patient will be undergoing induction of labor at [redacted] weeks  gestation. ---------------------------------------------------------------------- Recommendations  -NST at your office next week.  -Delivery at [redacted] weeks gestation. ----------------------------------------------------------------------                  Noralee Space, MD Electronically Signed Final Report   12/13/2020 06:18 pm ---------------------------------------------------------------------- [place] MFM UA CORD DOPPLER ----------------------------------------------------------------------  OBSTETRICS REPORT                       (Signed Final 12/13/2020 06:18 pm) ---------------------------------------------------------------------- Patient Info  ID #:       [id number]                          D.O.B.:  [birth year] (24 yrs)  Name:       [facility] [place]  Visit Date: 12/13/2020 10:55 am ---------------------------------------------------------------------- Performed By  Attending:        Noralee Space MD        Ref. Address:     [facility]                                                             [facility]                                                             [address]                                                             [address]                                                             [place]                                                             [address]  Performed By:     Fayne Norrie BS,      Location:         [facility]                    RDMS, RVT                                [facility]  Referred By:      Gean Maidens                    LAWHORN ---------------------------------------------------------------------- Orders  #  Description                           Code        Ordered By  1  [place] MFM FETAL BPP                      40981.1     RAVI SHANKAR     W/NONSTRESS  2  [place] MFM UA CORD DOPPLER                76820.02    RAVI SHANKAR  3  [place] MFM OB FOLLOW UP                   76816.01    RAVI SHANKAR  4  [place] MFM FETAL BPP                      91478.2     RAVI SHANKAR     W/NONSTRESS ADD'L GEST  5  [place] MFM OB FOLLOW UP ADDL              95621.30    RAVI SHANKAR     GEST  6  [place] MFM UA DOPPLER ADDL                76820.03    RAVI SHANKAR     GEST RE EVAL ----------------------------------------------------------------------  #  Order #                     Accession #                Episode #  1  [id number]                   [id number]                 [id number]  2  [id number]                   [id number]                 [id number]  3  [id number]                   [id number]                 [id number]  4  [id number]                   [id number]                 [id number]  5  [id number]                   [id number]                 [id number]  6  [id number]                   [id number]                 [id number] ---------------------------------------------------------------------- Indications  Maternal care for known or suspected poor       O36.5911  fetal growth, first trimester, fetus 1 IUGR  (Baby A)  Twin  pregnancy, di/di, second trimester        O30.042  Anemia during pregnancy in second trimester    O99.012  Short interval between pregancies, 2nd         O09.892  trimester  [redacted] weeks gestation of pregnancy                Z3A.34 ---------------------------------------------------------------------- Fetal Evaluation (Fetus A)  Num Of Fetuses:         2  Fetal Heart Rate(bpm):  137  Cardiac Activity:       Observed  Fetal Lie:              Maternal right side  Presentation:           Cephalic  Placenta:               Anterior  P. Cord Insertion:      Previously Visualized  Membrane Desc:      Dividing Membrane seen - Dichorionic.  Amniotic Fluid  AFI FV:      Within normal limits                              Largest Pocket(cm)                              4.44 ---------------------------------------------------------------------- Biophysical Evaluation (Fetus A)  Amniotic F.V:   Pocket => 2 cm             F. Tone:        Observed  F. Movement:    Observed                   N.S.T:          Reactive  F. Breathing:   Observed                   Score:          10/10 ---------------------------------------------------------------------- Biometry (Fetus A)  BPD:      81.1  mm     G. Age:  32w 4d          7  %    CI:         75.4   %    70 - 86                                                          FL/HC:      22.0   %    19.4 - 21.8  HC:      296.2  mm     G. Age:  32w 5d        1.3  %    HC/AC:      1.14        0.96 - 1.11  AC:      260.2  mm     G. Age:  30w 1d        < 1  %    FL/BPD:     80.4   %    71 - 87  FL:       65.2  mm     G. Age:  33w 4d  21  %    FL/AC:      25.1   %    20 - 24  Est. FW:    1823  gm           4 lb    1.9  %     FW Discordancy        15  % ---------------------------------------------------------------------- Gestational Age (Fetus A)  LMP:           35w 1d        Date:  04/11/20                  EDD:   01/16/21  U/S Today:     32w 2d                                        EDD:   02/05/21  Best:          34w 3d     Det. ByMarcella Dubs         EDD:   01/21/21                                      (07/04/20) ---------------------------------------------------------------------- Anatomy (Fetus A)  Cranium:               Appears normal         LVOT:                   Previously seen  Cavum:                 Appears normal         Aortic Arch:            Previously seen  Ventricles:            Appears normal         Ductal Arch:            Previously seen  Choroid Plexus:        Previously seen        Diaphragm:              Appears normal  Cerebellum:            Previously seen        Stomach:                Appears normal, left                                                                        sided  Posterior Fossa:       Previously seen        Abdomen:                Previously seen  Nuchal Fold:           )                      Abdominal Wall:  Appears nml (cord                                                                        insert, abd wall)  Face:                  Orbits and profile     Cord Vessels:           Previously seen                         previously seen  Lips:                  Previously seen        Kidneys:                Appear normal  Palate:                Previously seen        Bladder:                Appears normal  Thoracic:              Appears normal         Spine:                  Previously seen  Heart:                 Appears normal         Upper Extremities:      Previously seen                         (4CH, axis, and                         situs)  RVOT:                  Previously seen        Lower Extremities:      Previously seen  Other:  Female gender previously seen. Right Heel visualized previously .          Technically difficult due to fetal  position. ---------------------------------------------------------------------- Doppler - Fetal Vessels (Fetus A)  Umbilical Artery   S/D     %tile      RI    %tile                             ADFV    RDFV   2.58       55    0.61       60                                No      No ---------------------------------------------------------------------- Fetal Evaluation (Fetus B)  Num Of Fetuses:         2  Fetal Heart Rate(bpm):  136  Cardiac Activity:       Observed  Fetal Lie:  Maternal left side  Presentation:           Cephalic  Placenta:               Posterior  P. Cord Insertion:      Previously Visualized  Membrane Desc:      Dividing Membrane seen - Dichorionic.  Amniotic Fluid  AFI FV:      Within normal limits                              Largest Pocket(cm)                              2.86 ---------------------------------------------------------------------- Biophysical Evaluation (Fetus B)  Amniotic F.V:   Pocket => 2 cm             F. Tone:        Observed  F. Movement:    Observed                   N.S.T:          Reactive  F. Breathing:   Observed                   Score:          10/10 ---------------------------------------------------------------------- Biometry (Fetus B)  BPD:      80.3  mm     G. Age:  32w 2d        4.2  %    CI:        72.69   %    70 - 86                                                          FL/HC:      21.8   %    19.4 - 21.8  HC:      299.5  mm     G. Age:  33w 1d          3  %    HC/AC:      1.03        0.96 - 1.11  AC:      290.2  mm     G. Age:  33w 0d         17  %    FL/BPD:     81.4   %    71 - 87  FL:       65.4  mm     G. Age:  33w 5d         23  %    FL/AC:      22.5   %    20 - 24  Est. FW:    2134  gm    4 lb 11 oz      14  %     FW Discordancy     0 \ 15 % ---------------------------------------------------------------------- Gestational Age (Fetus B)  LMP:           35w 1d        Date:  04/11/20                  EDD:  01/16/21  U/S Today:     33w 0d                                        EDD:   01/31/21  Best:          34w 3d     Det. ByMarcella Dubs         EDD:   01/21/21                                      (07/04/20) ---------------------------------------------------------------------- Anatomy (Fetus B)  Cranium:               Appears normal         Aortic Arch:            Previously seen  Cavum:                 Appears normal         Ductal Arch:            Previously seen  Ventricles:            Appears normal         Diaphragm:              Appears normal  Choroid Plexus:        Previously seen        Stomach:                Appears normal, left                                                                        sided  Cerebellum:            Appears normal         Abdomen:                Appears normal  Posterior Fossa:       Previously seen        Abdominal Wall:         Previously seen  Face:                  Not well visualized    Cord Vessels:           Previously seen  Lips:                  Not well visualized    Kidneys:                Appear normal  Palate:                Not well visualized    Bladder:                Appears normal  Thoracic:              Appears normal         Spine:                  Previously  seen  Heart:                 Previously seen        Upper Extremities:      Previously seen  RVOT:                  Not well visualized    Lower Extremities:      Previously seen  LVOT:                  Appears normal  Other:  Female gender previously seen. 3VV  and 3VT normal previously. ---------------------------------------------------------------------- Doppler - Fetal Vessels (Fetus B)  Umbilical Artery   S/D     %tile      RI    %tile                             ADFV    RDFV   2.44       46    0.59       52                                No      No ---------------------------------------------------------------------- Impression  Dichorionic-diamniotic twin  pregnancy with selective severe  fetal growth restriction in twin A.  Twin A: Maternal right, cephalic presentation, anterior  placenta, female fetus.  Amniotic fluid is normal and good fetal  activity seen.  The estimated fetal weight is at the 2nd  percentile.  Interval weight gain is 481 g over 3 weeks.  Abdominal circumference measurement is at the 1st  percentile and head circumference measurement is between -  2 and -1 SD (no evidence of microcephaly).  Antenatal testing is reassuring.  Umbilical artery Doppler  showed normal forward diastolic flow.  NST is reactive.  BPP  10/10.  Twin B: Maternal left, cephalic presentation, posterior  placenta, female fetus.  Amniotic fluid is normal and good fetal  activity seen.  Fetal growth is appropriate for gestational age.  Umbilical artery Doppler showed normal forward diastolic  flow.  Antenatal testing is reassuring.  NST is reactive.  BPP  10/10.  Growth discordancy: 15%.  Patient will be undergoing induction of labor at [redacted] weeks  gestation. ---------------------------------------------------------------------- Recommendations  -NST at your office next week.  -Delivery at [redacted] weeks gestation. ----------------------------------------------------------------------                  Noralee Space, MD Electronically Signed Final Report   12/13/2020 06:18 pm ----------------------------------------------------------------------  Assessment : Gayathri Futrell is a 25 y.o. [id number] at [redacted]w[redacted]d being admitted for labor induction for PPROM.  H/o hyperthyroidism this pregnancy. Di-di pregnancy with growth restriction of Twin A. Short interval pregnancy. GBS unknown.   Plan: Labor: Expectant management.  Patient desires minimal interventions as possible.  Declines IV at this time. Discussion had with patient regarding for likely need of IV at time of delivery in case of difficult delivery of twin B as it is currently in breech position and requirement of a  C-section. Patient notes understanding. Is willing to receive and IV at that time.  Also informed of need for delivery in OR for twin delivery.  Analgesia as needed. Declines epidural.  May utilize nitrous oxide if needed.  FWB: Reassuring fetal heart tracing.  Patient ultimately desires minimal monitoring (spot heart tones only) however discussed again that  due to growth restriction of one of her twin, would recommend a higher level of monitoring. Patient ok to agree with intermittent monitoring as long as she is able to move around. GBS unknown.  Discussed need for antibiotics as she is preterm with ruptured membranes and unknown status. Still declines IV, but is willing to receive one dose of IM until GBS results return. Will give IM PCN.  Delivery plan: Hopeful for vaginal delivery   Hildred Laser, MD [facility]

## 2020-12-24 NOTE — Progress Notes (Signed)
Intrapartum Progress Note  S: Patient feeling more pressure in her bottom, feeling urge to push.   O: Blood pressure 96/67, pulse 97, temperature 98.4 F (36.9 C), temperature source Oral, resp. rate 17, height 5\' 6"  (1.676 m), weight 78 kg, last menstrual period 04/11/2020, unknown if currently breastfeeding. Gen App: NAD, uncomfortable with contractions Abdomen: soft, gravid Twin A:  FHT:  Baseline rate 140 bpm Variability moderate   Accelerations present   Decelerations none   Twin B:  FHT:  Baseline rate 140 bpm Variability moderate   Accelerations present   Decelerations none   Contractions: Every 2-4 mins  Cervix: 6/80-90/0 to +1  Extremities: Nontender, no edema.  Pitocin: None  Labs:   Results for orders placed or performed during the hospital encounter of 12/24/20  Resp Panel by RT-PCR (Flu A&B, Covid) Nasopharyngeal Swab   Specimen: Nasopharyngeal Swab; Nasopharyngeal(NP) swabs in vial transport medium  Result Value Ref Range   SARS Coronavirus 2 by RT PCR NEGATIVE NEGATIVE   Influenza A by PCR NEGATIVE NEGATIVE   Influenza B by PCR NEGATIVE NEGATIVE  Group B strep by PCR   Specimen: Vaginal/Rectal; Genital  Result Value Ref Range   Group B strep by PCR POSITIVE (A) NEGATIVE  CBC with Differential/Platelet  Result Value Ref Range   WBC 9.0 4.0 - 10.5 K/uL   RBC 4.45 3.87 - 5.11 MIL/uL   Hemoglobin 9.0 (L) 12.0 - 15.0 g/dL   HCT 12/26/20 (L) 40.9 - 81.1 %   MCV 66.1 (L) 80.0 - 100.0 fL   MCH 20.2 (L) 26.0 - 34.0 pg   MCHC 30.6 30.0 - 36.0 g/dL   RDW 91.4 (H) 78.2 - 95.6 %   Platelets 277 150 - 400 K/uL   nRBC 0.2 0.0 - 0.2 %   Neutrophils Relative % 56 %   Neutro Abs 5.1 1.7 - 7.7 K/uL   Lymphocytes Relative 28 %   Lymphs Abs 2.5 0.7 - 4.0 K/uL   Monocytes Relative 10 %   Monocytes Absolute 0.9 0.1 - 1.0 K/uL   Eosinophils Relative 4 %   Eosinophils Absolute 0.3 0.0 - 0.5 K/uL   Basophils Relative 1 %   Basophils Absolute 0.1 0.0 - 0.1 K/uL   WBC  Morphology MORPHOLOGY UNREMARKABLE    Smear Review Normal platelet morphology    Immature Granulocytes 1 %   Abs Immature Granulocytes 0.05 0.00 - 0.07 K/uL   Polychromasia PRESENT   Type and screen [facility]  Result Value Ref Range   ABO/RH(D) O POS    Antibody Screen NEG    Sample Expiration      12/27/2020,2359 Performed at [facility], [address] [place], [place]     Assessment:  1: Twin (di-di) IUP at [redacted]w[redacted]d 2. Growth restriction of Twin A 3. GBS positive 4. Hyperthyroidism in pregnancy  Plan:  1. Twin pregnancy, continue monitoring, anticipate vaginal delivery soon. Will get OR set up.  2. Attempted dosing of PCN IM, however patient complained of significant burning at time of injection and so did not receive full dose. Will likely be inadequately treated based on rapid progression of delivery. Will need further monitoring postpartum.  3. Hyperthyroidism stable.  4. Patient desires pain medication. Is using nitrous oxide but desires something stronger. Discussed risk of effects of IV medicine on fetal transitioning if she continues to progress rapidly.  Patient notes understanding. Will give small dose of Fentanyl (50 mcg).    [redacted]w[redacted]d,  MD [facility]    Hildred Laser, MD 12/24/2020 11:18 AM

## 2020-12-24 NOTE — Anesthesia Postprocedure Evaluation (Signed)
Anesthesia Post Note  Patient: Counselling psychologist  Procedure(s) Performed: AN AD [place]  Patient location during evaluation: L&D Anesthesia Type: MAC Level of consciousness: awake and alert Pain management: pain level controlled Vital Signs Assessment: post-procedure vital signs reviewed and stable Respiratory status: spontaneous breathing, nonlabored ventilation and respiratory function stable Cardiovascular status: stable and blood pressure returned to baseline Postop Assessment: no apparent nausea or vomiting Anesthetic complications: no   No notable events documented.   Last Vitals:  Vitals:   12/24/20 1245 12/24/20 1533  BP: 111/70 106/70  Pulse: (!) 111 96  Resp:    Temp:  36.7 C  SpO2:  100%    Last Pain:  Vitals:   12/24/20 1533  TempSrc: Oral  PainSc:                  Martha Clan

## 2020-12-24 NOTE — Anesthesia Preprocedure Evaluation (Signed)
Anesthesia Evaluation  Patient identified by MRN, date of birth, ID band Patient awake    Reviewed: Allergy & Precautions, H&P , NPO status , Patient's Chart, lab work & pertinent test results  Airway Mallampati: I   Neck ROM: full    Dental no notable dental hx. (+) Chipped   Pulmonary asthma ,    Pulmonary exam normal        Cardiovascular negative cardio ROS Normal cardiovascular exam     Neuro/Psych negative neurological ROS  negative psych ROS   GI/Hepatic negative GI ROS, Neg liver ROS,   Endo/Other  negative endocrine ROSHyperthyroidism   Renal/GU negative Renal ROS  negative genitourinary   Musculoskeletal   Abdominal   Peds  Hematology  (+) Blood dyscrasia, anemia ,   Anesthesia Other Findings   Reproductive/Obstetrics (+) Pregnancy                             Anesthesia Physical Anesthesia Plan  ASA: 1  Anesthesia Plan: MAC   Post-op Pain Management:    Induction:   PONV Risk Score and Plan:   Airway Management Planned:   Additional Equipment:   Intra-op Plan:   Post-operative Plan:   Informed Consent: I have reviewed the patients History and Physical, chart, labs and discussed the procedure including the risks, benefits and alternatives for the proposed anesthesia with the patient or authorized representative who has indicated his/her understanding and acceptance.     Dental Advisory Given  Plan Discussed with: Anesthesiologist, CRNA and Surgeon  Anesthesia Plan Comments:         Anesthesia Quick Evaluation

## 2020-12-25 LAB — CBC
HCT: 27 % — ABNORMAL LOW (ref 36.0–46.0)
Hemoglobin: 8.3 g/dL — ABNORMAL LOW (ref 12.0–15.0)
MCH: 20.7 pg — ABNORMAL LOW (ref 26.0–34.0)
MCHC: 30.7 g/dL (ref 30.0–36.0)
MCV: 67.3 fL — ABNORMAL LOW (ref 80.0–100.0)
Platelets: 227 10*3/uL (ref 150–400)
RBC: 4.01 MIL/uL (ref 3.87–5.11)
RDW: 18.6 % — ABNORMAL HIGH (ref 11.5–15.5)
WBC: 10.9 10*3/uL — ABNORMAL HIGH (ref 4.0–10.5)
nRBC: 0.3 % — ABNORMAL HIGH (ref 0.0–0.2)

## 2020-12-25 LAB — RPR: RPR Ser Ql: NONREACTIVE

## 2020-12-25 NOTE — Progress Notes (Signed)
Post Partum Day # 1, s/p SVD of di-di twins (EGA 36.0 weeks)  Subjective: no complaints, up ad lib, and tolerating PO  Objective: Temp:  [97.7 F (36.5 C)-98.2 F (36.8 C)] 97.7 F (36.5 C) (07/26 0737) Pulse Rate:  [74-111] 74 (07/26 0737) Resp:  [18-20] 18 (07/26 0737) BP: (95-117)/(59-75) 109/60 (07/26 0737) SpO2:  [100 %] 100 % (07/26 0737)  Physical Exam:  General: alert and no distress  Lungs: clear to auscultation bilaterally Breasts: normal appearance, no masses or tenderness Heart: regular rate and rhythm, S1, S2 normal, no murmur, click, rub or gallop Abdomen: soft, non-tender; bowel sounds normal; no masses,  no organomegaly Pelvis: Lochia: appropriate, Uterine Fundus: firm Extremities: DVT Evaluation: No evidence of DVT seen on physical exam. Negative Homan's sign. No cords or calf tenderness. No significant calf/ankle edema.   Recent Labs    12/24/20 0719 12/25/20 0612  HGB 9.0* 8.3*  HCT 29.4* 27.0*    Assessment/Plan: Doing well postpartum Anemia postpartum, asymptomatic. To treat with PO rion supplementation Breastfeeding and formula supplementing, Lactation consult Contraception undecided.  Neonates in special care nursery for feeding issues and regulation of temperatures.  Likely d/c home tomorrow.    LOS: 1 day   Hildred Laser, MD [facility]

## 2020-12-26 MED ORDER — IBUPROFEN 600 MG PO TABS: 600.0000 mg | ORAL_TABLET | Freq: Four times a day (QID) | ORAL | 0 refills | Status: DC

## 2020-12-26 MED ORDER — FERROUS SULFATE 325 (65 FE) MG PO TABS: 325.0000 mg | ORAL_TABLET | Freq: Two times a day (BID) | ORAL | 3 refills | Status: DC

## 2020-12-26 NOTE — Progress Notes (Addendum)
Pt discharged home. Twin A will be rooming in tonight while Twin B is still in SCN. Discharge instructions, prescriptions and follow up appointment given to and reviewed with pt. Pt verbalized understanding.

## 2020-12-26 NOTE — Discharge Summary (Signed)
Postpartum Discharge Summary      Patient Name: Cindy Brock DOB: 03-12-1996 MRN: [id number]  Date of admission: 12/24/2020 Delivery date:   Jackeline, Gutknecht [[id number]]  12/24/2020    Temiloluwa, Recchia [[id number]]  12/24/2020  Delivering provider:    Tykera, Skates [[id number]]  Dali, Kraner [[id number]]  Rubie Maid  Date of discharge: 12/26/2020  Admitting diagnosis: Preterm premature rupture of membranes (PPROM) with onset of labor after 24 hours of rupture in third trimester, antepartum [O42.113] Intrauterine pregnancy: [redacted]w[redacted]d    Secondary diagnosis:  Principal Problem:   Preterm premature rupture of membranes (PPROM) with onset of labor within 24 hours of rupture in third trimester, antepartum Active Problems:   Hyperthyroidism affecting pregnancy in second trimester   History of asthma   Rubella non-immune status, antepartum   Maternal varicella, non-immune   Immunization consent not given   Short interval between pregnancies affecting pregnancy in first trimester, antepartum   Anemia affecting pregnancy   Dichorionic diamniotic twin pregnancy in third trimester   Preterm premature rupture of membranes (PPROM) with onset of labor after 24 hours of rupture in third trimester, antepartum  Additional problems: None    Discharge diagnosis: Preterm Pregnancy Delivered and Anemia. Maternal Varicella and Rubella non-immune                                             Post partum procedures: None Augmentation:  None Complications: None  Hospital course: Onset of Labor With Vaginal Delivery      25y.o. yo [id number] 386w0das admitted in Latent Labor with PPROM on 12/24/2020. Patient had an uncomplicated labor course as follows:  Membrane Rupture Time/Date:    PrKinzleigh, Kandler0[[id number]]5:10 AM    PrBrittanni, Cariker0[[id number]]5:10 AM ,   PrJacquiline, Zurcher0[[id number]]12/24/2020    PrDynesha, Woolen0[[id number]]12/24/2020    Delivery Method:   PrTerrill Mohr0[[id number]]Vaginal, Spontaneous    PrTaite, Baldassari0[[id number]]Vaginal, Breech  Episiotomy:    PrEssence, Merle0[[id number]]None    PrJerlyn, Pain0[[id number]]None  Lacerations:     PrSherese, Heyward0[[id number]]None    PrSheana, Bir0[[id number]]None  Patient had an uncomplicated postpartum course.  She is ambulating, tolerating a regular diet, passing flatus, and urinating well. Patient is discharged home in stable condition on 12/26/20.  Newborn Data: Birth date:   PrRemas, Sobel0[[id number]]12/24/2020    PrKyriana, Yankee0[[id number]]12/24/2020  Birth time:   PrKalsey, Lull0[[id number]]9:57 AM    PrMerrily Brittle0[[id number]]10:18 AM  Gender:   PrIvyana, Locey0[[id number]]Female    PrTiyanna, Larcom0[[id number]]Female  Living status:   PrJadia, Capers0[[id number]]Living    PrKaydince, Towles0[[id number]]Living  Apgars:   PrJillane, Po0[[id number]]8 [address]0[[id number]]1 ,   PrArdeth, Repetto0[[id number]]9 [address][[id number]]6  Weight:   PrSatina, Jerrell0[[id number]]2120 g    PrLinlee, Cromie0[[id number]]2340 g   Magnesium Sulfate received: No BMZ received:  No Rhophylac:No MMR:No (declines) T-DaP: declines Flu: No Transfusion:No  Physical exam  Vitals:   12/25/20 0737 12/25/20 1625 12/25/20 2312 12/26/20 0817  BP: 109/60 (!) 97/52 104/67 117/85  Pulse: 74 84 79 95  Resp: 18 18 18 18   Temp: 97.7 F (36.5 C) 98.1 F (36.7 C) 98.5 F (36.9 C) 99 F (37.2 C)  TempSrc: Oral  Oral Oral  SpO2: 100% 100% 99% 100%  Weight:      Height:       General: alert, cooperative, and no distress Lungs: clear to auscultation bilaterally Heart: regular rate and rhythm, S1, S2 normal, no murmur, click, rub or gallop Abdomen: soft, non-tender; bowel sounds normal; no masses,  no organomegaly Lochia:  appropriate Uterine Fundus: firm Incision: N/A DVT Evaluation: No evidence of DVT seen on physical exam. Negative Homan's sign. No cords or calf tenderness. No significant calf/ankle edema.   Labs: Lab Results  Component Value Date   WBC 10.9 (H) 12/25/2020   HGB 8.3 (L) 12/25/2020   HCT 27.0 (L) 12/25/2020   MCV 67.3 (L) 12/25/2020   PLT 227 12/25/2020   CMP Latest Ref Rng & Units 04/12/2019  Glucose 70 - 99 mg/dL 99  BUN 6 - 20 mg/dL 7  Creatinine 0.44 - 1.00 mg/dL 0.58  Sodium 135 - 145 mmol/L 137  Potassium 3.5 - 5.1 mmol/L 4.2  Chloride 98 - 111 mmol/L 105  CO2 22 - 32 mmol/L 24  Calcium 8.9 - 10.3 mg/dL 9.3  Total Protein 6.5 - 8.1 g/dL 7.4  Total Bilirubin 0.3 - 1.2 mg/dL 1.3(H)  Alkaline Phos 38 - 126 U/L 44  AST 15 - 41 U/L 18  ALT 0 - 44 U/L 13   Edinburgh Score: Edinburgh Postnatal Depression Scale Screening Tool 12/25/2020  I have been able to laugh and see the funny side of things. 0  I have looked forward with enjoyment to things. 0  I have blamed myself unnecessarily when things went wrong. 1  I have been anxious or worried for no good reason. 0  I have felt scared or panicky for no good reason. 0  Things have been getting on top of me. 1  I have been so unhappy that I have had difficulty sleeping. 0  I have felt sad or miserable. 0  I have been so unhappy that I have been crying. 0  The thought of harming myself has occurred to me. 0  Edinburgh Postnatal Depression Scale Total 2      After visit meds:  Allergies as of 12/26/2020   No Known Allergies      Medication List     TAKE these medications    acetaminophen 500 MG tablet Commonly known as: TYLENOL Take 2 tablets (1,000 mg total) by mouth every 6 (six) hours as needed for fever or headache.   cyclobenzaprine 10 MG tablet Commonly known as: FLEXERIL Take 1 tablet (10 mg total) by mouth 3 (three) times daily as needed for muscle spasms.   ferrous sulfate 325 (65 FE) MG tablet Take 1  tablet (325 mg total) by mouth 2 (two) times daily with a meal.   ibuprofen 600 MG tablet Commonly known as: ADVIL Take 1 tablet (600 mg total) by mouth every 6 (six) hours.   lidocaine 5 % Commonly known as: LIDODERM Place 1 patch onto the skin daily. Remove & Discard patch within 12 hours or as directed by MD   PRENATAL 1 PO Take by mouth.  Discharge home in stable condition Infant Feeding: Breast and formula Infant Disposition:rooming in x 2 Discharge instruction: per After Visit Summary and Postpartum booklet. Activity: Advance as tolerated. Pelvic rest for 6 weeks.  Diet: routine diet Anticipated Birth Control: Unsure Postpartum Appointment:6 weeks Additional Postpartum F/U: Postpartum Depression checkup in 2 weeks (video visit) Future Appointments:No future appointments. Follow up Visit:  Follow-up Information     Rubie Maid, MD Follow up.   Specialties: Obstetrics and Gynecology, Radiology Why: 2 week postpartum mood check (video visit) 6 week postpartum visit Contact information: [address] [place] [address] [phone]                     12/26/2020 Rubie Maid, MD [facility]

## 2021-02-01 ENCOUNTER — Encounter: Payer: Medicaid Other | Admitting: Obstetrics and Gynecology

## 2021-02-11 ENCOUNTER — Encounter: Payer: Self-pay | Admitting: Obstetrics and Gynecology

## 2021-03-05 ENCOUNTER — Telehealth: Payer: Self-pay | Admitting: Obstetrics and Gynecology

## 2021-03-05 NOTE — Telephone Encounter (Signed)
Please advise. Thanks Cyriah Childrey 

## 2021-03-05 NOTE — Telephone Encounter (Signed)
Cindy Brock called in and states she needed to make an appointment to discuss IUD possibilities and to be seen for her 6 week ppv.  I scheduled her with the first available for November 8th.  She wants to know if this is ok to wait until November 8th or should she come in sooner since her 6 wk ppv was supposed to be 9/2.  Please advise.

## 2021-03-06 NOTE — Telephone Encounter (Signed)
Patient called no answer LM via VM that I was asked by [facility] to contact her to see if I could get her scheduled earlier than her appointment date in November. Pt was advised to please contact the office as soon as possible to schedule an earlier appointment. Sent pt a [employer] geologist.

## 2021-03-08 NOTE — Telephone Encounter (Signed)
Pt called back and has an appointment later on this month.

## 2021-03-26 NOTE — Progress Notes (Deleted)
   OBSTETRICS POSTPARTUM CLINIC PROGRESS NOTE  Subjective:     Cindy Brock is a 25 y.o. [phone] female who presents for a postpartum visit. She is  14  week postpartum following a spontaneous vaginal delivery. I have fully reviewed the prenatal and intrapartum course. The delivery was at 36 gestational weeks.  Anesthesia: Nitrous Oxide; IV Fentanyl x 1 dose. Postpartum course has been ***. Baby's course has been ***. Baby is feeding by {breast/bottle:69}. Bleeding: patient {HAS HAS QBH:41937} not resumed menses, with No LMP recorded.. Bowel function is {normal:32111}. Bladder function is {normal:32111}. Patient {is/is not:9024} sexually active. Contraception method desired is IUD. Postpartum depression screening: {neg default:13464::"negative"}.  EDPS score is ***.    The following portions of the patient's history were reviewed and updated as appropriate: allergies, current medications, past family history, past medical history, past social history, past surgical history, and problem list.  Review of Systems {ros; complete:30496}   Objective:    There were no vitals taken for this visit.  General:  alert and no distress   Breasts:  inspection negative, no nipple discharge or bleeding, no masses or nodularity palpable  Lungs: clear to auscultation bilaterally  Heart:  regular rate and rhythm, S1, S2 normal, no murmur, click, rub or gallop  Abdomen: soft, non-tender; bowel sounds normal; no masses,  no organomegaly.  ***Well healed Pfannenstiel incision   Vulva:  normal  Vagina: normal vagina, no discharge, exudate, lesion, or erythema  Cervix:  no cervical motion tenderness and no lesions  Corpus: normal size, contour, position, consistency, mobility, non-tender  Adnexa:  normal adnexa and no mass, fullness, tenderness  Rectal Exam: Not performed.         Labs:  Lab Results  Component Value Date   HGB 8.3 (L) 12/25/2020     Assessment:   No diagnosis found.   Plan:    1.  Contraception: {method:5051} 2. Will check Hgb for h/o postpartum anemia of less than 10.  3. Follow up in: {1-10:13787} {time; units:19136} or as needed.    Hildred Laser, MD [facility]

## 2021-03-27 ENCOUNTER — Encounter: Payer: Medicaid Other | Admitting: Obstetrics and Gynecology

## 2021-03-27 NOTE — Progress Notes (Deleted)
   OBSTETRICS POSTPARTUM CLINIC PROGRESS NOTE  Subjective:     Cindy Brock is a 25 y.o. [phone] female who presents for a postpartum visit. She is  4    month  postpartum following a spontaneous vaginal delivery. I have fully reviewed the prenatal and intrapartum course. The delivery was at 36 gestational weeks.  Anesthesia: Nitrous Oxide; IV Fentanyl x 1 dose. Postpartum course has been ***. Baby's course has been ***. Baby is feeding by {breast/bottle:69}. Bleeding: patient {HAS HAS GUY:40347} not resumed menses, with No LMP recorded.. Bowel function is {normal:32111}. Bladder function is {normal:32111}. Patient {is/is not:9024} sexually active. Contraception method desired is {contraceptive method:5051}. Postpartum depression screening: {neg default:13464::"negative"}.  EDPS score is ***.    The following portions of the patient's history were reviewed and updated as appropriate: allergies, current medications, past family history, past medical history, past social history, past surgical history, and problem list.  Review of Systems {ros; complete:30496}   Objective:    There were no vitals taken for this visit.  General:  alert and no distress   Breasts:  inspection negative, no nipple discharge or bleeding, no masses or nodularity palpable  Lungs: clear to auscultation bilaterally  Heart:  regular rate and rhythm, S1, S2 normal, no murmur, click, rub or gallop  Abdomen: soft, non-tender; bowel sounds normal; no masses,  no organomegaly.  ***Well healed Pfannenstiel incision   Vulva:  normal  Vagina: normal vagina, no discharge, exudate, lesion, or erythema  Cervix:  no cervical motion tenderness and no lesions  Corpus: normal size, contour, position, consistency, mobility, non-tender  Adnexa:  normal adnexa and no mass, fullness, tenderness  Rectal Exam: Not performed.         Labs:  Lab Results  Component Value Date   HGB 8.3 (L) 12/25/2020     Assessment:   No diagnosis  found.   Plan:    1. Contraception: {method:5051} 2. Will check Hgb for h/o postpartum anemia of less than 10.  3. Follow up in: 4 weeks or as needed.    Hildred Laser, MD [facility]

## 2021-03-28 ENCOUNTER — Encounter: Payer: Self-pay | Admitting: Obstetrics and Gynecology

## 2021-04-08 NOTE — Progress Notes (Addendum)
OBSTETRICS POSTPARTUM CLINIC PROGRESS NOTE  Subjective:     Cindy Brock is a 25 y.o. [phone] female who presents for a postpartum visit and routine exam. She is approximately 4 months postpartum following a spontaneous twin vaginal delivery (second twin delivered breech). I have fully reviewed the prenatal and intrapartum course. The delivery was at 36 gestational weeks.  Anesthesia: Nitrous Oxide; IV Fentanyl x 1 dose. No complications in her postpartum course.. Both babies are now doing well (Twin B remained inpatient 2 weeks due to feeding and growth). Babies are feeding by bottle - Similac Neosure. Bleeding: patient has resumed menses, with Patient's last menstrual period was 03/16/2021. Bowel function is normal. Bladder function is normal. Patient is sexually active, using withdrawal method. Contraception method desired is IUD. Postpartum depression screening: negative.  EDPS score is 2.     The following portions of the patient's history were reviewed and updated as appropriate: allergies, current medications, past family history, past medical history, past social history, past surgical history, and problem list.  Review of Systems Pertinent items noted in HPI and remainder of comprehensive ROS otherwise negative.   Objective:    BP 125/74   Pulse 92   Resp 16   Ht 5\' 6"  (1.676 m)   Wt 150 lb 11.2 oz (68.4 kg)   LMP 03/16/2021   Breastfeeding No   BMI 24.32 kg/m   General:  alert and no distress   Breasts:  inspection negative, no nipple discharge or bleeding, no masses or nodularity palpable  Lungs: clear to auscultation bilaterally  Heart:  regular rate and rhythm, S1, S2 normal, no murmur, click, rub or gallop  Abdomen: soft, non-tender; bowel sounds normal; no masses,  no organomegaly.    Vulva:  normal  Vagina: normal vagina, no discharge, exudate, lesion, or erythema  Cervix:  no cervical motion tenderness and no lesions  Corpus: normal size, contour, position,  consistency, mobility, non-tender  Adnexa:  normal adnexa and no mass, fullness, tenderness  Rectal Exam: Not performed.         Labs:  Lab Results  Component Value Date   HGB 8.3 (L) 12/25/2020     Assessment:   1. Encounter for gynecological examination without abnormal finding   2. Encounter for insertion of mirena IUD   3. Postpartum anemia   4. Possible pregnancy, not confirmed      Plan:   1. Contraception: IUD. Patient with initial UPT with possible faint positive line.  Second test run, appears negative. Discussed with patient that I could not rule out the possibility of pregnancy. Will order a BHCG.  Patient still desires IUD placement today although pregnancy not confirmed as she has difficulties with transportation and child care, and if hormone levels are negative, will not have to return for placement. If positive, notes she will figure out a way to return for further management.  Understands the risks of IUD in pregnancy.  2. Will check Hgb for h/o postpartum anemia of less than 10.  3. Follow up in: as indicated based on lab results, or as needed.    GYNECOLOGY OFFICE PROCEDURE NOTE  Cindy Brock is a 25 y.o. [phone] here for Mirena IUD insertion.  Last pap smear was on 07/12/2020 and was normal.  IUD Insertion Procedure Note Patient identified, informed consent performed, consent signed.   Discussed risks of irregular bleeding, cramping, infection, malpositioning or misplacement of the IUD outside the uterus which may require further procedure such as laparoscopy. Also discussed >  99% contraception efficacy, increased risk of ectopic pregnancy with failure of method.   Emphasized that this did not protect against STIs, condoms recommended during all sexual encounters. Discussed risks of IUD at time of possible pregnancy. Urine pregnancy test equivocal (one faint positive, repeat negative).  Speculum placed in the vagina.  Cervix visualized.  Cleaned with Betadine x 2.   Grasped anteriorly with a single tooth tenaculum.  Uterus sounded to 10 cm.  Mirena IUD placed per manufacturer's recommendations.  Strings trimmed to 3 cm. Tenaculum was removed, good hemostasis noted.  Patient tolerated procedure well.   Patient was given post-procedure instructions.  She was advised to have backup contraception for one week.  Patient was also asked to check IUD strings periodically and follow up in 4 weeks for IUD check.    Hildred Laser, MD [facility]

## 2021-04-09 ENCOUNTER — Ambulatory Visit (INDEPENDENT_AMBULATORY_CARE_PROVIDER_SITE_OTHER): Payer: Medicaid Other | Admitting: Obstetrics and Gynecology

## 2021-04-09 ENCOUNTER — Other Ambulatory Visit: Payer: Self-pay

## 2021-04-09 ENCOUNTER — Encounter: Payer: Self-pay | Admitting: Obstetrics and Gynecology

## 2021-04-09 VITALS — BP 125/74 | HR 92 | Resp 16 | Ht 66.0 in | Wt 150.7 lb

## 2021-04-09 DIAGNOSIS — Z30430 Encounter for insertion of intrauterine contraceptive device: Secondary | ICD-10-CM | POA: Diagnosis not present

## 2021-04-09 DIAGNOSIS — O9081 Anemia of the puerperium: Secondary | ICD-10-CM | POA: Diagnosis not present

## 2021-04-09 DIAGNOSIS — Z3200 Encounter for pregnancy test, result unknown: Secondary | ICD-10-CM | POA: Diagnosis not present

## 2021-04-09 DIAGNOSIS — Z30019 Encounter for initial prescription of contraceptives, unspecified: Secondary | ICD-10-CM

## 2021-04-09 DIAGNOSIS — Z01419 Encounter for gynecological examination (general) (routine) without abnormal findings: Secondary | ICD-10-CM

## 2021-04-09 LAB — POCT URINE PREGNANCY: Preg Test, Ur: POSITIVE — AB

## 2021-04-09 MED ORDER — MISOPROSTOL 200 MCG PO TABS
600.0000 ug | ORAL_TABLET | Freq: Once | ORAL | 0 refills | Status: DC
Start: 2021-04-09 — End: 2021-04-13

## 2021-04-10 LAB — HUMAN CHORIONIC GONADOTROPIN(HCG),B-SUBUNIT,QUANTITATIVE): HCG, Beta Chain, Quant, S: 35 m[IU]/mL

## 2021-04-13 ENCOUNTER — Other Ambulatory Visit: Payer: Self-pay | Admitting: Obstetrics and Gynecology

## 2021-04-13 MED ORDER — MISOPROSTOL 200 MCG PO TABS: 600.0000 ug | ORAL_TABLET | Freq: Once | ORAL | 0 refills | Status: DC

## 2021-04-17 ENCOUNTER — Other Ambulatory Visit: Payer: Self-pay | Admitting: Obstetrics and Gynecology

## 2021-04-17 ENCOUNTER — Telehealth: Payer: Self-pay | Admitting: Obstetrics and Gynecology

## 2021-04-17 DIAGNOSIS — Z3200 Encounter for pregnancy test, result unknown: Secondary | ICD-10-CM

## 2021-04-24 ENCOUNTER — Other Ambulatory Visit: Payer: Self-pay

## 2021-04-24 ENCOUNTER — Other Ambulatory Visit: Payer: Medicaid Other

## 2021-04-24 DIAGNOSIS — Z3200 Encounter for pregnancy test, result unknown: Secondary | ICD-10-CM

## 2021-04-26 LAB — HUMAN CHORIONIC GONADOTROPIN(HCG),B-SUBUNIT,QUANTITATIVE): HCG, Beta Chain, Quant, S: 5 m[IU]/mL

## 2021-06-07 IMAGING — US US MFM OB DETAIL+14 WK
1 series · 14 of 28 positions shown · non-contrast
Comparison: none

[Series 1: us mfm ob detail+14 wk · 237 acquisitions, 14 frames shown]
[im 9/237]
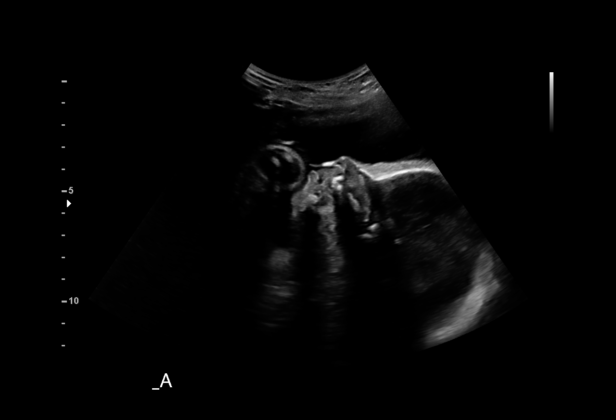
[im 27/237]
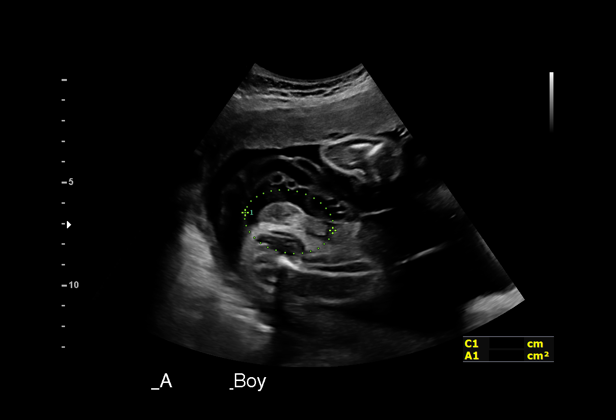
[im 44/237]
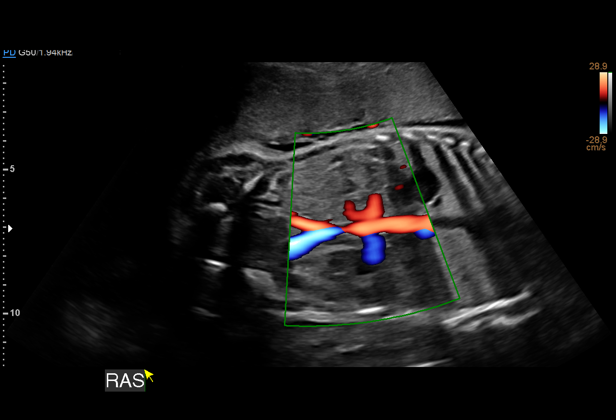
[im 62/237]
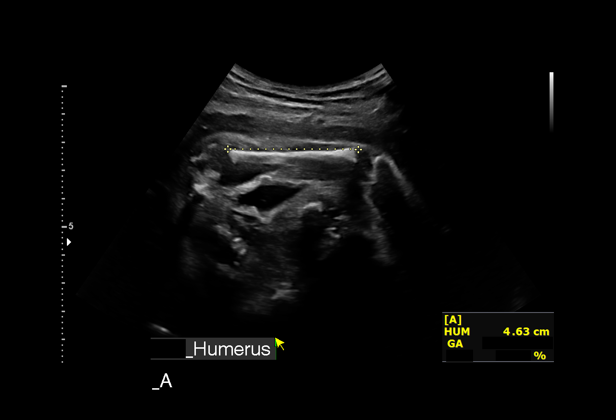
[im 79/237]
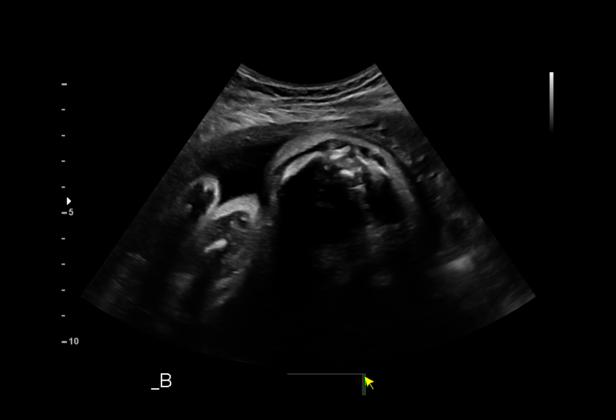
[im 97/237]
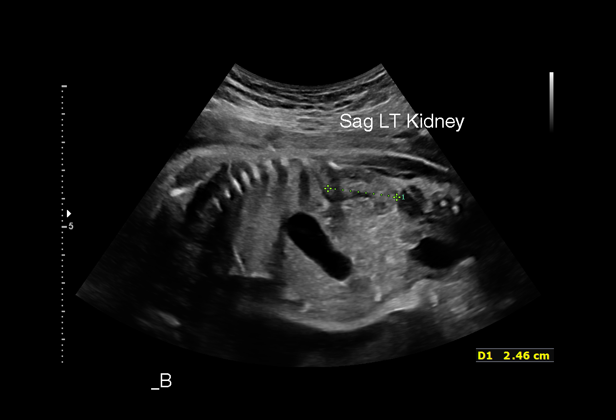
[im 114/237]
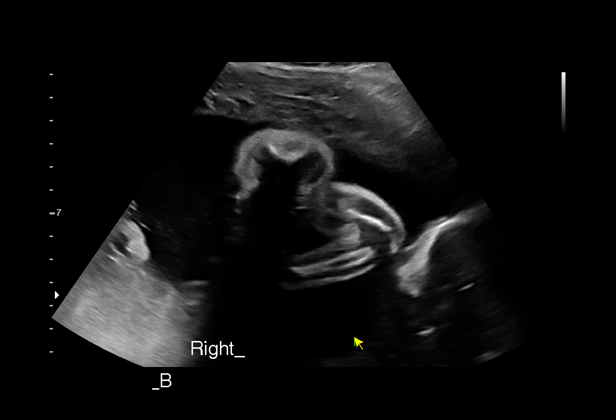
[im 132/237]
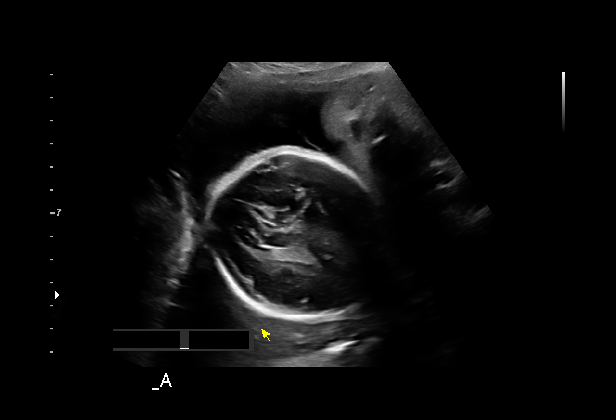
[im 149/237]
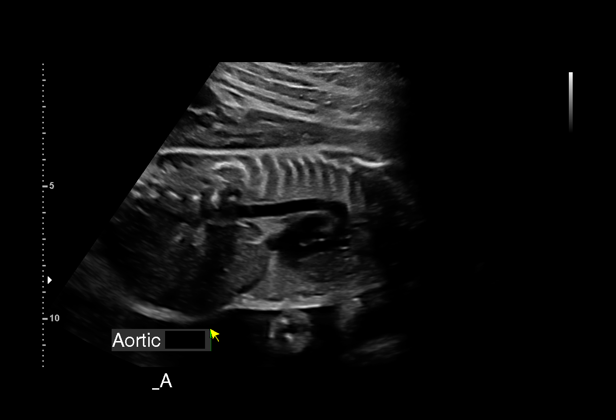
[im 167/237]
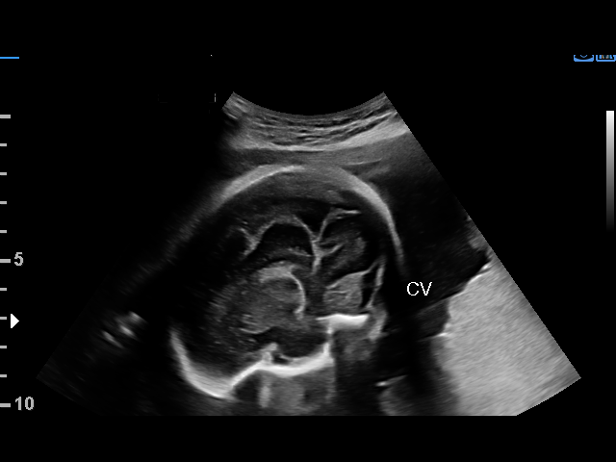
[im 184/237]
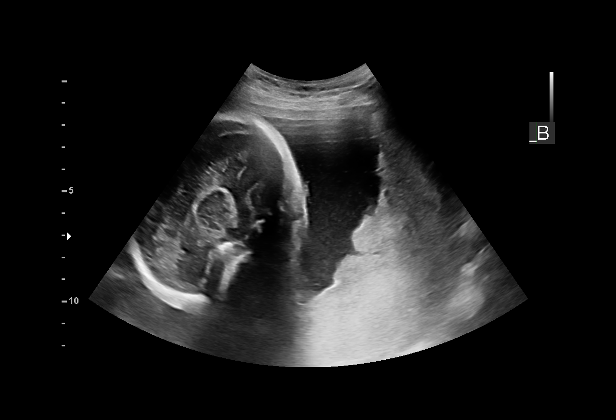
[im 202/237]
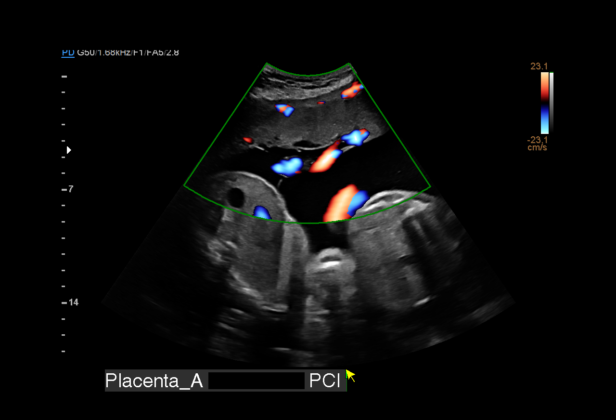
[im 219/237]
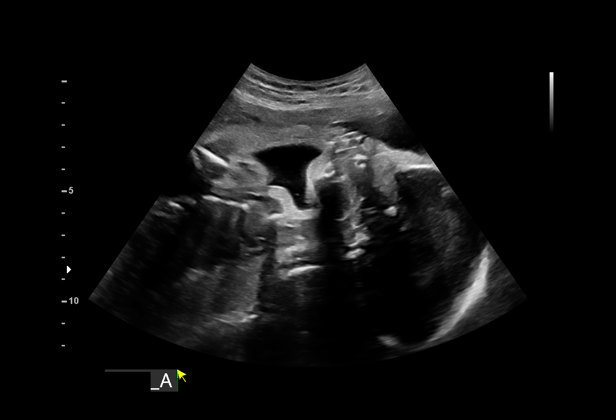
[im 237/237]
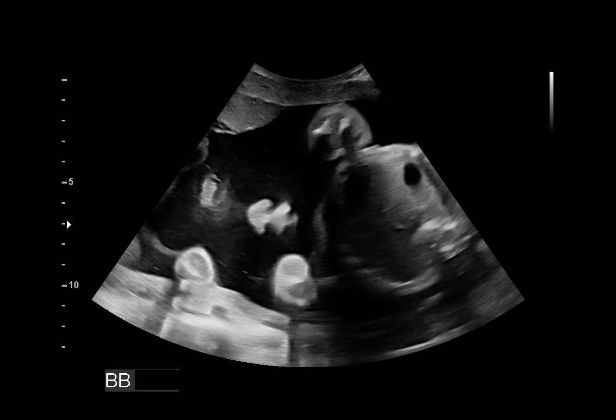

[14 of 28 positions shown; findings below may reference images not displayed]

[REDACTED]
                   GUERECA

 2  US MFM OB DETAIL ADDL GEST            76811.02    DON LOLITO ONACRAM
    +14 WK
 3  US MFM UA CORD DOPPLER                76820.02    DON LOLITO ONACRAM
 4  US MFM UA ADDL GEST                   76820.01    DON LOLITO ONACRAM

Indications

 Maternal care for known or suspected poor
 fetal growth, first trimester, fetus 1 IUGR
 (Baby A)
 28 weeks gestation of pregnancy
 Twin pregnancy, di/di, second trimester
 Encounter for antenatal screening for
 malformations
 Anemia during pregnancy in second trimester
 Short interval between pregancies, 2nd
 trimester
 Hyperthyroid
 Low Risk NIPS
Fetal Evaluation (Fetus A)

 Num Of Fetuses:         2
 Fetal Heart Rate(bpm):  147
 Cardiac Activity:       Observed
 Fetal Lie:              Maternal Lower Right side
 Presentation:           Cephalic
 Placenta:               Anterior
 P. Cord Insertion:      Visualized, central
 Membrane Desc:      Dividing Membrane seen - Dichorionic.

 Amniotic Fluid
 AFI FV:      Within normal limits

                             Largest Pocket(cm)

Biometry (Fetus A)

 BPD:      68.7  mm     G. Age:  27w 4d         19  %    CI:        75.51   %    70 - 86
                                                         FL/HC:      20.4   %    18.8 -
 HC:      250.7  mm     G. Age:  27w 2d        3.5  %    HC/AC:      1.14        1.05 -
 AC:      220.7  mm     G. Age:  26w 4d          5  %    FL/BPD:     74.5   %    71 - 87
 FL:       51.2  mm     G. Age:  27w 3d         14  %    FL/AC:      23.2   %    20 - 24
 HUM:      46.3  mm     G. Age:  27w 2d         24  %

 Est. FW:    1111  gm      2 lb 4 oz      6  %     FW Discordancy        16  %
Gestational Age (Fetus A)

 LMP:           29w 0d        Date:  04/11/20                 EDD:   01/16/21
 U/S Today:     27w 2d                                        EDD:   01/28/21
 Best:          28w 2d     Det. By:  Early Ultrasound         EDD:   01/21/21
                                     (07/04/20)
Anatomy (Fetus A)

 Cranium:               Appears normal         LVOT:                   Appears normal
 Cavum:                 Appears normal         Aortic Arch:            Appears normal
 Ventricles:            Appears normal         Ductal Arch:            Previously seen
 Choroid Plexus:        Appears normal         Diaphragm:              Appears normal
 Cerebellum:            Previously seen        Stomach:                Appears normal, left
                                                                       sided
 Posterior Fossa:       Previously seen        Abdomen:                Appears normal
 Nuchal Fold:           Not applicable (>20    Abdominal Wall:         Appears nml (cord
                        wks GA)                                        insert, abd wall)
 Face:                  Appears normal         Cord Vessels:           Appears normal (3
                        (orbits and profile)                           vessel cord)
 Lips:                  Previously seen        Kidneys:                Appear normal
 Palate:                Appears normal         Bladder:                Appears normal
 Thoracic:              Appears normal         Spine:                  Appears normal
 Heart:                 Appears normal         Upper Extremities:      Appears normal
                        (4CH, axis, and
                        situs)
 RVOT:                  Appears normal         Lower Extremities:      Appears normal

 Other:  Fetus a male.Right Heelvisualized. Technically difficult due to fetal
         position.
Targeted Anatomy (Fetus A)

 Thorax
 3 Vessel View:         Appears normal         3 V Trachea View:       Appears normal
Doppler - Fetal Vessels (Fetus A)
 Umbilical Artery
  S/D     %tile                                              ADFV    RDFV
  2.56       24                                                 No      No

Fetal Evaluation (Fetus B)

 Num Of Fetuses:         2
 Fetal Heart Rate(bpm):  158
 Cardiac Activity:       Observed
 Fetal Lie:              Maternal Upper Left side
 Presentation:           Breech
 Placenta:               Posterior
 P. Cord Insertion:      Visualized
 Membrane Desc:      Dividing Membrane seen - Dichorionic.

 Amniotic Fluid
 AFI FV:      Within normal limits

                             Largest Pocket(cm)

Biometry (Fetus B)

 BPD:      66.9  mm     G. Age:  27w 0d          7  %    CI:        69.39   %    70 - 86
                                                         FL/HC:      21.3   %    18.8 -
 HC:      256.4  mm     G. Age:  27w 6d         11  %    HC/AC:      1.08        1.05 -
 AC:      238.4  mm     G. Age:  28w 1d         38  %    FL/BPD:     81.5   %    71 - 87
 FL:       54.5  mm     G. Age:  28w 6d         50  %    FL/AC:      22.9   %    20 - 24
 HUM:      48.2  mm     G. Age:  28w 2d         46  %

 Est. FW:    9952  gm    2 lb 10 oz      36  %     FW Discordancy     0 \ 16 %
Gestational Age (Fetus B)

 LMP:           29w 0d        Date:  04/11/20                 EDD:   01/16/21
 U/S Today:     28w 0d                                        EDD:   01/23/21
 Best:          28w 2d     Det. By:  Early Ultrasound         EDD:   01/21/21
                                     (07/04/20)
Anatomy (Fetus B)

 Cranium:               Appears normal         Aortic Arch:            Appears normal
 Cavum:                 Appears normal         Ductal Arch:            Appears normal
 Ventricles:            Not well visualized    Diaphragm:              Appears normal
 Choroid Plexus:        Appears normal         Stomach:                Appears normal, left
                                                                       sided
 Cerebellum:            Appears normal         Abdomen:                Appears normal
 Posterior Fossa:       Appears normal         Abdominal Wall:         Appears nml (cord
                                                                       insert, abd wall)
 Nuchal Fold:           Not applicable (>20    Cord Vessels:           Appears normal (3
                        wks GA)                                        vessel cord)
 Face:                  Not well visualized    Kidneys:                Appear normal
 Lips:                  Not well visualized    Bladder:                Appears normal
 Thoracic:              Appears normal         Spine:                  Appears normal
 Heart:                 Appears normal         Upper Extremities:      Appears normal
                        (4CH, axis, and
                        situs)
 RVOT:                  Not well visualized    Lower Extremities:      Appears normal
 LVOT:                  Appears normal

 Other:  Fetus a male. Technically difficult due to fetal position. 3VV  and 3VT
         normal.
Targeted Anatomy (Fetus B)

 Thorax
 IVC:                   Appears normal
Doppler - Fetal Vessels (Fetus B)

 Umbilical Artery
  S/D     %tile                                              ADFV    RDFV
   2.7       32                                                 No      No

Cervix Uterus Adnexa

 Cervix
 Not visualized (advanced GA >20wks)
Impression

 Dichorionic Diamniotic Twin Pregnancy.
 Patient returned for detailed fetal anatomical survey.  Fetal
 anatomy could not be completed on her previous visit (see
 previous report).
 On cell free fetal DNA screening, the risks of fetal
 aneuploidies are not increased.
 Patient is unsure of her LMP date and we have assigned her
 EDD based on 11-week ultrasound.
 She does not have gestational diabetes.  Patient reports she
 has hypothyroidism but euthyroid now.

 Twin A: Lower fetus maternal right, cephalic presentation,
 anterior placenta, male fetus.  The estimated fetal weight is at
 the 6th percentile.  Abdominal circumference measurement is
 at the 5th percentile.  Amniotic fluid is normal and good fetal
 activity seen.  No markers of aneuploidies or obvious fetal
 structural defects are seen.  Umbilical artery Doppler showed
 normal forward diastolic flow.
 Twin B: Upper fetus, maternal left, breech presentation,
 posterior placenta, male fetus.  Fetal growth is appropriate for
 gestational age.  Amniotic fluid is normal and good fetal
 activity seen.  No markers of aneuploidies or obvious fetal
 structural defects are seen. Umbilical artery Doppler showed
 normal forward diastolic flow.
 Growth discordancy: 16%.
 I counseled the patient on selective fetal growth restriction in
 twin A.  Most likely cause is placental insufficiency.  I
 explained the protocol of monitoring fetal growth restriction.
 Her blood pressure today at her office is 111/65 mmHg.
Recommendations

 -UA Doppler in 2 weeks.
 - UA Doppler and BPP in 3 weeks and then weekly till
 delivery.
 -Fetal growth assessment in 3 weeks.
 -We will recommend timing of delivery based on future fetal
 growth assessments and antenatal testing.
                 Kisu, Shouhei

## 2021-07-31 ENCOUNTER — Encounter: Payer: Medicaid Other | Admitting: Obstetrics and Gynecology

## 2021-07-31 NOTE — Progress Notes (Deleted)
? ? ?  GYNECOLOGY PROGRESS NOTE ? ?Subjective:  ? ? Patient ID: Cindy Brock, female    DOB: [birth year], 26 y.o.   MRN: [id number] ? ?HPI ? Patient is a 26 y.o. [phone] female who presents for IUD removal. She had IUD placed a postpartum visit on 04/12/2021. ? ?{Common ambulatory SmartLinks:19316} ? ?Review of Systems ?{ros; complete:30496}  ? ?Objective:  ? not currently breastfeeding. There is no height or weight on file to calculate BMI. ?General appearance: {general exam:16600} ?Abdomen: {abdominal exam:16834} ?Pelvic: {pelvic exam:16852::"cervix normal in appearance","external genitalia normal","no adnexal masses or tenderness","no cervical motion tenderness","rectovaginal septum normal","uterus normal size, shape, and consistency","vagina normal without discharge"} ?Extremities: {extremity exam:5109} ?Neurologic: {neuro exam:17854} ? ? ?Assessment:  ? ?No diagnosis found.  ? ?Plan:  ? ?There are no diagnoses linked to this encounter.  ? ? ? ? ?Hildred Laser, MD ?[facility] ? ?

## 2021-08-20 ENCOUNTER — Encounter: Payer: Self-pay | Admitting: Obstetrics and Gynecology

## 2021-08-20 ENCOUNTER — Ambulatory Visit (INDEPENDENT_AMBULATORY_CARE_PROVIDER_SITE_OTHER): Payer: Medicaid Other | Admitting: Obstetrics and Gynecology

## 2021-08-20 ENCOUNTER — Other Ambulatory Visit: Payer: Self-pay

## 2021-08-20 VITALS — BP 107/75 | HR 83 | Resp 16 | Ht 66.0 in | Wt 158.5 lb

## 2021-08-20 DIAGNOSIS — Z975 Presence of (intrauterine) contraceptive device: Secondary | ICD-10-CM

## 2021-08-20 NOTE — Progress Notes (Signed)
? ? ?  GYNECOLOGY PROGRESS NOTE ? ?Subjective:  ? ? Patient ID: Cindy Brock, female    DOB: 02/12/1996, 26 y.o.   MRN: [id number] ? ?HPI ? Patient is a 26 y.o. [phone] female who presents for Mirena IUD removal.  IUD placed on 04/09/2021. Her partner has agreed to have a vasectomy so she wants to remove her IUD. He has a scheduled consultation.  Denies any current issues with IUD. ? ?The following portions of the patient's history were reviewed and updated as appropriate: allergies, current medications, past family history, past medical history, past social history, past surgical history, and problem list. ? ?Review of Systems ?Pertinent items noted in HPI and remainder of comprehensive ROS otherwise negative.  ? ?Objective:  ?Body mass index is 25.58 kg/m?Marland Kitchen Blood pressure 107/75, pulse 83, resp. rate 16, height 5\' 6"  (1.676 m), weight 158 lb 8 oz (71.9 kg), not currently breastfeeding.  ?General appearance: alert and no distress ? ? ?Assessment:  ? ?IUD (intrauterine device) in place ?  ? ?Plan:  ? ?-I discussed with patient that it would be best for her to wait until the procedure has been completed and verification of efficacy has been performed.  I discussed that this could take up to 2 months after the procedure has been performed.  As patient is very fertile, I would recommend that she hold off on removal until the confirmation testing for his vasectomy be performed.  Patient notes understanding, is okay with plan. ? ? ? , MD ?[facility]  ?

## 2021-12-09 NOTE — Progress Notes (Deleted)
    GYNECOLOGY OFFICE PROCEDURE NOTE  [name] is a 26 y.o. [id number] here for Liletta IUD removal. No GYN concerns.  Last pap smear was on 07/12/2020 and was normal.  IUD Removal  Patient identified, informed consent performed, consent signed.  Patient was in the dorsal lithotomy position, normal external genitalia was noted.  A speculum was placed in the patient's vagina, normal discharge was noted, no lesions. The cervix was visualized, no lesions, no abnormal discharge.  The strings of the IUD were grasped and pulled using ring forceps. The IUD was removed in its entirety. *** (The strings of the IUD were not visualized, so Kelly forceps were introduced into the endometrial cavity and the IUD was grasped and removed in its entirety).  Patient tolerated the procedure well.    Patient will use *** for contraception/***plans for pregnancy soon and she was told to avoid teratogens, take PNV and folic acid.  Routine preventative health maintenance measures emphasized.    Hildred Laser, MD [facility]

## 2021-12-10 ENCOUNTER — Encounter: Payer: Medicaid Other | Admitting: Obstetrics and Gynecology

## 2021-12-26 ENCOUNTER — Encounter: Payer: Medicaid Other | Admitting: Obstetrics and Gynecology

## 2021-12-30 NOTE — Progress Notes (Deleted)
    GYNECOLOGY OFFICE PROCEDURE NOTE  Cindy Brock is a 25 y.o. [id number] here for Liletta IUD removal. No GYN concerns.  Last pap smear was on 07/12/2020 and was normal.  IUD Removal  Patient identified, informed consent performed, consent signed.  Patient was in the dorsal lithotomy position, normal external genitalia was noted.  A speculum was placed in the patient's vagina, normal discharge was noted, no lesions. The cervix was visualized, no lesions, no abnormal discharge.  The strings of the IUD were grasped and pulled using ring forceps. The IUD was removed in its entirety. *** (The strings of the IUD were not visualized, so Kelly forceps were introduced into the endometrial cavity and the IUD was grasped and removed in its entirety).  Patient tolerated the procedure well.    Patient will use *** for contraception/***plans for pregnancy soon and she was told to avoid teratogens, take PNV and folic acid.  Routine preventative health maintenance measures emphasized.    Anika Cherry, MD [facility]    

## 2021-12-31 ENCOUNTER — Encounter: Payer: Medicaid Other | Admitting: Obstetrics and Gynecology

## 2022-03-13 ENCOUNTER — Encounter: Payer: Self-pay | Admitting: Certified Nurse Midwife

## 2022-06-14 ENCOUNTER — Ambulatory Visit
Admission: EM | Admit: 2022-06-14 | Discharge: 2022-06-14 | Disposition: A | Discharge status: Home / Self Care | Payer: Medicaid Other | Attending: Emergency Medicine | Admitting: Emergency Medicine

## 2022-06-14 DIAGNOSIS — N898 Other specified noninflammatory disorders of vagina: Secondary | ICD-10-CM | POA: Diagnosis present

## 2022-06-14 LAB — POCT URINE PREGNANCY: Preg Test, Ur: NEGATIVE

## 2022-06-14 NOTE — ED Triage Notes (Signed)
Patient to Urgent Care with complaints of vaginal discharge. Reports thick/ yellow discharge. Symptoms started yesterday morning.  Requests STI testing. Denies any urinary symptoms.

## 2022-06-14 NOTE — Discharge Instructions (Signed)
Your vaginal tests are pending.  If your test results are positive, we will call you.  You may require treatment at that time.  Do not have sexual activity for at least 7 days.    Follow up with your primary care provider or gynecologist if your symptoms are not improving.

## 2022-06-14 NOTE — ED Provider Notes (Signed)
Cindy Brock    CSN: [id number] Arrival date & time: 06/14/22  1138      History   Chief Complaint Chief Complaint  Patient presents with   Vaginal Discharge    Sti testing - Entered by patient    HPI Cindy Brock is a 27 y.o. female.  Patient presents with 1 day history of yellow vaginal discharge.  She denies rash, abdominal pain, dysuria, pelvic pain, fever, or other symptoms.  No treatment at home.  She is sexually active in monogamous marriage.  Her medical history includes asthma and thyroid disease.  LMP: 06/07/2022.    The history is provided by the patient and medical records.    Past Medical History:  Diagnosis Date   Asthma    Hyperthyroidism    Thyroid disease     Patient Active Problem List   Diagnosis Date Noted   Preterm premature rupture of membranes (PPROM) with onset of labor within 24 hours of rupture in third trimester, antepartum 12/24/2020   Preterm premature rupture of membranes (PPROM) with onset of labor after 24 hours of rupture in third trimester, antepartum 12/24/2020   Dichorionic diamniotic twin pregnancy in third trimester    Leg swelling in pregnancy in third trimester    Indication for care in labor and delivery, antepartum 12/14/2020   Anemia affecting pregnancy 10/26/2020   Pregnancy related hip pain, antepartum, second trimester 09/24/2020   Short interval between pregnancies affecting pregnancy in first trimester, antepartum 07/12/2020   Back pain affecting pregnancy in third trimester 10/03/2019   Immunization consent not given 10/03/2019   Rubella non-immune status, antepartum 09/12/2019   Maternal varicella, non-immune 09/12/2019   Hyperthyroidism affecting pregnancy in second trimester 06/14/2019   History of asthma 06/14/2019    Past Surgical History:  Procedure Laterality Date   NO PAST SURGERIES      OB History     Gravida  5   Para  3   Term  2   Preterm  1   AB  2   Living  4      SAB  2   IAB       Ectopic      Multiple  1   Live Births  4            Home Medications    Prior to Admission medications   Medication Sig Start Date End Date Taking? Authorizing Provider  ferrous sulfate 325 (65 FE) MG tablet Take 1 tablet (325 mg total) by mouth 2 (two) times daily with a meal. 12/26/20   Rubie Maid, MD    Family History Family History  Problem Relation Age of Onset   Rheum arthritis Mother    Hypothyroidism Maternal Grandmother     Social History Social History   Tobacco Use   Smoking status: Never   Smokeless tobacco: Never  Vaping Use   Vaping Use: Never used  Substance Use Topics   Alcohol use: Not Currently    Comment: social    Drug use: Not Currently    Types: Marijuana     Allergies   Patient has no known allergies.   Review of Systems Review of Systems  Constitutional:  Negative for chills and fever.  Gastrointestinal:  Negative for abdominal pain, diarrhea, nausea and vomiting.  Genitourinary:  Positive for vaginal discharge. Negative for dysuria, flank pain, hematuria and pelvic pain.  Skin:  Negative for rash.  All other systems reviewed and are negative.  Physical Exam Triage Vital Signs ED Triage Vitals  Enc Vitals Group     BP 06/14/22 1220 110/68     Pulse --      Resp --      Temp --      Temp src --      SpO2 --      Weight 06/14/22 1218 150 lb (68 kg)     Height 06/14/22 1218 5\' 6"  (1.676 m)     Head Circumference --      Peak Flow --      Pain Score 06/14/22 1215 0     Pain Loc --      Pain Edu? --      Excl. in [place]? --    No data found.  Updated Vital Signs BP 110/68   Pulse (!) 107   Temp 98 F (36.7 C)   Resp 18   Ht 5\' 6"  (1.676 m)   Wt 150 lb (68 kg)   LMP 06/07/2022   SpO2 98%   BMI 24.21 kg/m   Visual Acuity Right Eye Distance:   Left Eye Distance:   Bilateral Distance:    Right Eye Near:   Left Eye Near:    Bilateral Near:     Physical Exam Vitals and nursing note reviewed.   Constitutional:      General: She is not in acute distress.    Appearance: Normal appearance. She is well-developed. She is not ill-appearing.  HENT:     Mouth/Throat:     Mouth: Mucous membranes are moist.  Cardiovascular:     Rate and Rhythm: Normal rate and regular rhythm.     Heart sounds: Normal heart sounds.  Pulmonary:     Effort: Pulmonary effort is normal. No respiratory distress.     Breath sounds: Normal breath sounds.  Abdominal:     General: Bowel sounds are normal.     Palpations: Abdomen is soft.     Tenderness: There is no abdominal tenderness. There is no right CVA tenderness, left CVA tenderness, guarding or rebound.  Musculoskeletal:     Cervical back: Neck supple.  Skin:    General: Skin is warm and dry.  Neurological:     Mental Status: She is alert.  Psychiatric:        Mood and Affect: Mood normal.        Behavior: Behavior normal.      UC Treatments / Results  Labs (all labs ordered are listed, but only abnormal results are displayed) Labs Reviewed  POCT URINE PREGNANCY  CERVICOVAGINAL ANCILLARY ONLY    EKG   Radiology No results found.  Procedures Procedures (including critical care time)  Medications Ordered in UC Medications - No data to display  Initial Impression / Assessment and Plan / UC Course  I have reviewed the triage vital signs and the nursing notes.  Pertinent labs & imaging results that were available during my care of the patient were reviewed by me and considered in my medical decision making (see chart for details).    Vaginal discharge.  Patient obtained vaginal self swab for testing.  Discussed that we will call if test results are positive.  Discussed that she may require treatment at that time.  Instructed patient to abstain from sexual activity for at least 7 days.  Instructed her to follow-up with her PCP or gynecologist if her symptoms are not improving.  Patient agrees to plan of care.   Final Clinical  Impressions(s) /  UC Diagnoses   Final diagnoses:  Vaginal discharge     Discharge Instructions      Your vaginal tests are pending.  If your test results are positive, we will call you.  You may require treatment at that time.  Do not have sexual activity for at least 7 days.    Follow up with your primary care provider or gynecologist if your symptoms are not improving.        ED Prescriptions   None    PDMP not reviewed this encounter.   Mickie Bail, NP 06/14/22 1251

## 2022-06-16 ENCOUNTER — Telehealth (HOSPITAL_COMMUNITY): Payer: Self-pay | Admitting: Emergency Medicine

## 2022-06-16 LAB — CERVICOVAGINAL ANCILLARY ONLY
Bacterial Vaginitis (gardnerella): NEGATIVE
Candida Glabrata: NEGATIVE
Candida Vaginitis: POSITIVE — AB
Chlamydia: NEGATIVE
Comment: NEGATIVE
Comment: NEGATIVE
Comment: NEGATIVE
Comment: NEGATIVE
Comment: NEGATIVE
Comment: NORMAL
Neisseria Gonorrhea: NEGATIVE
Trichomonas: NEGATIVE

## 2022-06-16 MED ORDER — FLUCONAZOLE 150 MG PO TABS: 150.0000 mg | ORAL_TABLET | Freq: Once | ORAL | 0 refills | Status: AC

## 2023-01-28 ENCOUNTER — Ambulatory Visit: Payer: Medicaid Other

## 2023-02-04 NOTE — Progress Notes (Deleted)
GYNECOLOGY ANNUAL PHYSICAL EXAM PROGRESS NOTE  Subjective:    Cindy Brock is a 27 y.o. [phone] female who presents for an annual exam. The patient has no complaints today. The patient {is/is not/has never been:13135} sexually active. The patient participates in regular exercise: {yes/no/not asked:9010}. Has the patient ever been transfused or tattooed?: {yes/no/not asked:9010}. The patient reports that there {is/is not:9024} domestic violence in her life.    Menstrual History: Menarche age: *** No LMP recorded.     Gynecologic History:  Contraception: IUD History of STI's:  Last Pap: 07/12/2020. Results were: normal. Denies h/o abnormal pap smears. Last mammogram: Not age appropriate       OB History  Gravida Para Term Preterm AB Living  5 3 2 1 2 4   SAB IAB Ectopic Multiple Live Births  2 0 0 1 4    # Outcome Date GA Lbr Len/2nd Weight Sex Type Anes PTL Lv  5A Preterm 12/24/20 [redacted]w[redacted]d / 00:03 4 lb 10.8 oz (2.12 kg) M Vag-Spont None  LIV     Name: Szabo,BOYA Nalanie     Apgar1: 8  Apgar5: 9  5B Preterm 12/24/20 [redacted]w[redacted]d / 00:24 5 lb 2.5 oz (2.34 kg) M Vag-Breech None  LIV     Name: Pell,BOYB Nirvi     Apgar1: 1  Apgar5: 6  4 Term 11/23/19 [redacted]w[redacted]d   F Vag-Spont  Y LIV  3 SAB 2015          2 Term 02/24/13   5 lb (2.268 kg)  Vag-Spont  N LIV  1 SAB 2014            Past Medical History:  Diagnosis Date   Asthma    Hyperthyroidism    Thyroid disease     Past Surgical History:  Procedure Laterality Date   NO PAST SURGERIES      Family History  Problem Relation Age of Onset   Rheum arthritis Mother    Hypothyroidism Maternal Grandmother     Social History   Socioeconomic History   Marital status: Married    Spouse name: Chevaughn   Number of children: Not on file   Years of education: Not on file   Highest education level: Not on file  Occupational History   Not on file  Tobacco Use   Smoking status: Never   Smokeless tobacco: Never  Vaping Use    Vaping status: Never Used  Substance and Sexual Activity   Alcohol use: Not Currently    Comment: social    Drug use: Not Currently    Types: Marijuana   Sexual activity: Yes    Birth control/protection: None    Comment: undecided  Other Topics Concern   Not on file  Social History Narrative   Not on file   Social Determinants of Health   Financial Resource Strain: Not on file  Food Insecurity: Not on file  Transportation Needs: Not on file  Physical Activity: Not on file  Stress: Not on file  Social Connections: Not on file  Intimate Partner Violence: Not on file    Current Outpatient Medications on File Prior to Visit  Medication Sig Dispense Refill   ferrous sulfate 325 (65 FE) MG tablet Take 1 tablet (325 mg total) by mouth 2 (two) times daily with a meal. 60 tablet 3   No current facility-administered medications on file prior to visit.    No Known Allergies   Review of Systems Constitutional: negative for chills, fatigue, fevers  and sweats Eyes: negative for irritation, redness and visual disturbance Ears, nose, mouth, throat, and face: negative for hearing loss, nasal congestion, snoring and tinnitus Respiratory: negative for asthma, cough, sputum Cardiovascular: negative for chest pain, dyspnea, exertional chest pressure/discomfort, irregular heart beat, palpitations and syncope Gastrointestinal: negative for abdominal pain, change in bowel habits, nausea and vomiting Genitourinary: negative for abnormal menstrual periods, genital lesions, sexual problems and vaginal discharge, dysuria and urinary incontinence Integument/breast: negative for breast lump, breast tenderness and nipple discharge Hematologic/lymphatic: negative for bleeding and easy bruising Musculoskeletal:negative for back pain and muscle weakness Neurological: negative for dizziness, headaches, vertigo and weakness Endocrine: negative for diabetic symptoms including polydipsia, polyuria and skin  dryness Allergic/Immunologic: negative for hay fever and urticaria      Objective:  There were no vitals taken for this visit. There is no height or weight on file to calculate BMI.    General Appearance:    Alert, cooperative, no distress, appears stated age  Head:    Normocephalic, without obvious abnormality, atraumatic  Eyes:    PERRL, conjunctiva/corneas clear, EOM's intact, both eyes  Ears:    Normal external ear canals, both ears  Nose:   Nares normal, septum midline, mucosa normal, no drainage or sinus tenderness  Throat:   Lips, mucosa, and tongue normal; teeth and gums normal  Neck:   Supple, symmetrical, trachea midline, no adenopathy; thyroid: no enlargement/tenderness/nodules; no carotid bruit or JVD  Back:     Symmetric, no curvature, ROM normal, no CVA tenderness  Lungs:     Clear to auscultation bilaterally, respirations unlabored  Chest Wall:    No tenderness or deformity   Heart:    Regular rate and rhythm, S1 and S2 normal, no murmur, rub or gallop  Breast Exam:    No tenderness, masses, or nipple abnormality  Abdomen:     Soft, non-tender, bowel sounds active all four quadrants, no masses, no organomegaly.    Genitalia:    Pelvic:external genitalia normal, vagina without lesions, discharge, or tenderness, rectovaginal septum  normal. Cervix normal in appearance, no cervical motion tenderness, no adnexal masses or tenderness.  Uterus normal size, shape, mobile, regular contours, nontender.  Rectal:    Normal external sphincter.  No hemorrhoids appreciated. Internal exam not done.   Extremities:   Extremities normal, atraumatic, no cyanosis or edema  Pulses:   2+ and symmetric all extremities  Skin:   Skin color, texture, turgor normal, no rashes or lesions  Lymph nodes:   Cervical, supraclavicular, and axillary nodes normal  Neurologic:   CNII-XII intact, normal strength, sensation and reflexes throughout   .  Labs:  Lab Results  Component Value Date   WBC 10.9  (H) 12/25/2020   HGB 8.3 (L) 12/25/2020   HCT 27.0 (L) 12/25/2020   MCV 67.3 (L) 12/25/2020   PLT 227 12/25/2020    Lab Results  Component Value Date   CREATININE 0.58 04/12/2019   BUN 7 04/12/2019   NA 137 04/12/2019   K 4.2 04/12/2019   CL 105 04/12/2019   CO2 24 04/12/2019    Lab Results  Component Value Date   ALT 13 04/12/2019   AST 18 04/12/2019   ALKPHOS 44 04/12/2019   BILITOT 1.3 (H) 04/12/2019    Lab Results  Component Value Date   TSH <0.005 (L) 11/09/2020     Assessment:   1. Encounter for well woman exam with routine gynecological exam   2. Need for hepatitis C screening test  Plan:  Blood tests: Pending. Breast self exam technique reviewed and patient encouraged to perform self-exam monthly. Contraception: IUD. Discussed healthy lifestyle modifications. Mammogram  : Not age appropriate Pap smear  UTD . COVID vaccination status: Follow up in 1 year for annual exam   Hildred Laser, MD [facility]

## 2023-02-05 ENCOUNTER — Ambulatory Visit: Payer: Medicaid Other | Admitting: Obstetrics and Gynecology

## 2023-02-05 DIAGNOSIS — Z1159 Encounter for screening for other viral diseases: Secondary | ICD-10-CM

## 2023-02-05 DIAGNOSIS — Z01419 Encounter for gynecological examination (general) (routine) without abnormal findings: Secondary | ICD-10-CM

## 2023-02-19 ENCOUNTER — Ambulatory Visit
Admission: EM | Admit: 2023-02-19 | Discharge: 2023-02-19 | Disposition: A | Discharge status: Home / Self Care | Payer: Medicaid Other | Attending: Family Medicine | Admitting: Family Medicine

## 2023-02-19 DIAGNOSIS — N760 Acute vaginitis: Secondary | ICD-10-CM | POA: Diagnosis not present

## 2023-02-19 DIAGNOSIS — Z202 Contact with and (suspected) exposure to infections with a predominantly sexual mode of transmission: Secondary | ICD-10-CM

## 2023-02-19 LAB — POCT URINALYSIS DIP (MANUAL ENTRY)
Bilirubin, UA: NEGATIVE
Blood, UA: NEGATIVE
Glucose, UA: NEGATIVE mg/dL
Ketones, POC UA: NEGATIVE mg/dL
Leukocytes, UA: NEGATIVE
Nitrite, UA: NEGATIVE
Protein Ur, POC: 30 mg/dL — AB
Spec Grav, UA: 1.025 (ref 1.010–1.025)
Urobilinogen, UA: 1 E.U./dL
pH, UA: 7 (ref 5.0–8.0)

## 2023-02-19 LAB — POCT URINE PREGNANCY: Preg Test, Ur: NEGATIVE

## 2023-02-19 MED ORDER — FLUCONAZOLE 150 MG PO TABS: 150.0000 mg | ORAL_TABLET | ORAL | 0 refills | Status: AC

## 2023-02-19 NOTE — ED Provider Notes (Signed)
Renaldo Fiddler    CSN: [id number] Arrival date & time: 02/19/23  1550      History   Chief Complaint Chief Complaint  Patient presents with   SEXUALLY TRANSMITTED DISEASE   Vaginal Discharge    HPI Cindy Brock is a 27 y.o. female.    Vaginal Discharge Here for vaginal discharge like cottage cheese and itching.  No abdominal pain no fever or vomiting.  She has had recurrent infections.  Last treatment was about 4 months ago last menstrual cycle was August 25   She has not had any allergies to medicines.    Past Medical History:  Diagnosis Date   Asthma    Hyperthyroidism    Thyroid disease     Patient Active Problem List   Diagnosis Date Noted   Preterm premature rupture of membranes (PPROM) with onset of labor within 24 hours of rupture in third trimester, antepartum 12/24/2020   Preterm premature rupture of membranes (PPROM) with onset of labor after 24 hours of rupture in third trimester, antepartum 12/24/2020   Dichorionic diamniotic twin pregnancy in third trimester    Leg swelling in pregnancy in third trimester    Indication for care in labor and delivery, antepartum 12/14/2020   Anemia affecting pregnancy 10/26/2020   Pregnancy related hip pain, antepartum, second trimester 09/24/2020   Short interval between pregnancies affecting pregnancy in first trimester, antepartum 07/12/2020   Back pain affecting pregnancy in third trimester 10/03/2019   Immunization consent not given 10/03/2019   Rubella non-immune status, antepartum 09/12/2019   Maternal varicella, non-immune 09/12/2019   Hyperthyroidism affecting pregnancy in second trimester 06/14/2019   History of asthma 06/14/2019    Past Surgical History:  Procedure Laterality Date   NO PAST SURGERIES      OB History     Gravida  5   Para  3   Term  2   Preterm  1   AB  2   Living  4      SAB  2   IAB      Ectopic      Multiple  1   Live Births  4            Home  Medications    Prior to Admission medications   Medication Sig Start Date End Date Taking? Authorizing Provider  fluconazole (DIFLUCAN) 150 MG tablet Take 1 tablet (150 mg total) by mouth every 3 (three) days for 2 doses. 02/19/23 02/23/23 Yes Calil Amor, Janace Aris, MD    Family History Family History  Problem Relation Age of Onset   Rheum arthritis Mother    Hypothyroidism Maternal Grandmother     Social History Social History   Tobacco Use   Smoking status: Never   Smokeless tobacco: Never  Vaping Use   Vaping status: Never Used  Substance Use Topics   Alcohol use: Not Currently    Comment: social    Drug use: Not Currently    Types: Marijuana     Allergies   Patient has no known allergies.   Review of Systems Review of Systems  Genitourinary:  Positive for vaginal discharge.     Physical Exam Triage Vital Signs ED Triage Vitals  Encounter Vitals Group     BP 02/19/23 1610 133/75     Systolic BP Percentile --      Diastolic BP Percentile --      Pulse Rate 02/19/23 1610 79     Resp 02/19/23 1610 18  Temp 02/19/23 1610 97.6 F (36.4 C)     Temp src --      SpO2 02/19/23 1610 98 %     Weight --      Height --      Head Circumference --      Peak Flow --      Pain Score 02/19/23 1606 0     Pain Loc --      Pain Education --      Exclude from Growth Chart --    No data found.  Updated Vital Signs BP 133/75   Pulse 79   Temp 97.6 F (36.4 C)   Resp 18   LMP 01/25/2023   SpO2 98%   Visual Acuity Right Eye Distance:   Left Eye Distance:   Bilateral Distance:    Right Eye Near:   Left Eye Near:    Bilateral Near:     Physical Exam Vitals reviewed.  Constitutional:      General: She is not in acute distress.    Appearance: She is not toxic-appearing.  Skin:    Coloration: Skin is not pale.  Neurological:     Mental Status: She is alert and oriented to person, place, and time.  Psychiatric:        Behavior: Behavior normal.       UC Treatments / Results  Labs (all labs ordered are listed, but only abnormal results are displayed) Labs Reviewed  POCT URINALYSIS DIP (MANUAL ENTRY) - Abnormal; Notable for the following components:      Result Value   Protein Ur, POC =30 (*)    All other components within normal limits  HIV ANTIBODY (ROUTINE TESTING W REFLEX)  RPR  POCT URINE PREGNANCY  CERVICOVAGINAL ANCILLARY ONLY    EKG   Radiology No results found.  Procedures Procedures (including critical care time)  Medications Ordered in UC Medications - No data to display  Initial Impression / Assessment and Plan / UC Course  I have reviewed the triage vital signs and the nursing notes.  Pertinent labs & imaging results that were available during my care of the patient were reviewed by me and considered in my medical decision making (see chart for details).        Urinalysis is clear and UPT is negative. Blood is drawn to check HIV and syphilis. Vaginal self swab is done, and we will notify of any positives on that or on the bloodwork and treat per protocol.  Fluconazole was sent to treat empirically for yeast vaginitis.  Final Clinical Impressions(s) / UC Diagnoses   Final diagnoses:  Acute vaginitis  Exposure to STD     Discharge Instructions      Take fluconazole 150 mg--1 tablet every 3 days for 2 doses  Staff will notify you if there is anything positive on the swab or on the blood work (HIV and syphilis tests).      ED Prescriptions     Medication Sig Dispense Auth. Provider   fluconazole (DIFLUCAN) 150 MG tablet Take 1 tablet (150 mg total) by mouth every 3 (three) days for 2 doses. 2 tablet Marlinda Mike Janace Aris, MD      PDMP not reviewed this encounter.   Cindy Resides, MD 02/19/23 [phone]

## 2023-02-19 NOTE — ED Triage Notes (Signed)
Patient to Urgent Care with complaints of vaginal discharge. Concerned about recurrent yeast infection. Reports irritation/ soreness.   Symptoms started yesterday.   Requests STI testing. Denies any concerns.

## 2023-02-19 NOTE — Discharge Instructions (Signed)
Take fluconazole 150 mg--1 tablet every 3 days for 2 doses  Staff will notify you if there is anything positive on the swab or on the blood work (HIV and syphilis tests).

## 2023-02-20 LAB — HIV ANTIBODY (ROUTINE TESTING W REFLEX): HIV Screen 4th Generation wRfx: NONREACTIVE

## 2023-02-20 LAB — CERVICOVAGINAL ANCILLARY ONLY
Bacterial Vaginitis (gardnerella): NEGATIVE
Candida Glabrata: NEGATIVE
Candida Vaginitis: POSITIVE — AB
Chlamydia: NEGATIVE
Comment: NEGATIVE
Comment: NEGATIVE
Comment: NEGATIVE
Comment: NEGATIVE
Comment: NEGATIVE
Comment: NORMAL
Neisseria Gonorrhea: NEGATIVE
Trichomonas: NEGATIVE

## 2023-02-20 LAB — RPR: RPR Ser Ql: NONREACTIVE

## 2023-04-25 ENCOUNTER — Ambulatory Visit
Admission: EM | Admit: 2023-04-25 | Discharge: 2023-04-25 | Disposition: A | Discharge status: Home / Self Care | Payer: Medicaid Other | Attending: Emergency Medicine | Admitting: Emergency Medicine

## 2023-04-25 DIAGNOSIS — N898 Other specified noninflammatory disorders of vagina: Secondary | ICD-10-CM | POA: Diagnosis present

## 2023-04-25 LAB — POCT URINALYSIS DIP (MANUAL ENTRY)
Bilirubin, UA: NEGATIVE
Blood, UA: NEGATIVE
Glucose, UA: NEGATIVE mg/dL
Ketones, POC UA: NEGATIVE mg/dL
Leukocytes, UA: NEGATIVE
Nitrite, UA: NEGATIVE
Protein Ur, POC: NEGATIVE mg/dL
Spec Grav, UA: 1.02 (ref 1.010–1.025)
Urobilinogen, UA: 2 U/dL — AB
pH, UA: 7.5 (ref 5.0–8.0)

## 2023-04-25 MED ORDER — FLUCONAZOLE 150 MG PO TABS: 150.0000 mg | ORAL_TABLET | ORAL | 0 refills | Status: AC

## 2023-04-25 NOTE — ED Triage Notes (Signed)
Patient presents to UC for prescription for yeast infection. States she was seen in sept for yeast infection and did not pick up her medication.   Denies any symptoms today.

## 2023-04-25 NOTE — ED Provider Notes (Signed)
Renaldo Fiddler    CSN: [id number] Arrival date & time: 04/25/23  1501      History   Chief Complaint Chief Complaint  Patient presents with   Vaginitis    HPI Cindy Brock is a 27 y.o. female.   Patient presents for evaluation of Jilliann Subramanian thick vaginal discharge present for 1 month, has begun to experience pressure in the bladder whenever laying down and pain to the left kidney.  Has not attempted treatment.  Denies current sexual activity.  Endorses that she was seen approximately 1 month ago for a yeast infection but did not pick up medicine from the pharmacy.  Denies vaginal itching or odor, hematuria, dysuria, flank pain or fever.  Blood pressure 116/80, heart rate 74, O2 saturation 100, respirations 19, temperature 99.1 Past Medical History:  Diagnosis Date   Asthma    Hyperthyroidism    Thyroid disease     Patient Active Problem List   Diagnosis Date Noted   Preterm premature rupture of membranes (PPROM) with onset of labor within 24 hours of rupture in third trimester, antepartum 12/24/2020   Preterm premature rupture of membranes (PPROM) with onset of labor after 24 hours of rupture in third trimester, antepartum 12/24/2020   Dichorionic diamniotic twin pregnancy in third trimester    Leg swelling in pregnancy in third trimester    Indication for care in labor and delivery, antepartum 12/14/2020   Anemia affecting pregnancy 10/26/2020   Pregnancy related hip pain, antepartum, second trimester 09/24/2020   Short interval between pregnancies affecting pregnancy in first trimester, antepartum 07/12/2020   Back pain affecting pregnancy in third trimester 10/03/2019   Immunization consent not given 10/03/2019   Rubella non-immune status, antepartum 09/12/2019   Maternal varicella, non-immune 09/12/2019   Hyperthyroidism affecting pregnancy in second trimester 06/14/2019   History of asthma 06/14/2019    Past Surgical History:  Procedure Laterality Date   NO  PAST SURGERIES      OB History     Gravida  5   Para  3   Term  2   Preterm  1   AB  2   Living  4      SAB  2   IAB      Ectopic      Multiple  1   Live Births  4            Home Medications    Prior to Admission medications   Medication Sig Start Date End Date Taking? Authorizing Provider  fluconazole (DIFLUCAN) 150 MG tablet Take 1 tablet (150 mg total) by mouth every 3 (three) days for 2 doses. 04/25/23 04/29/23 Yes Ruhaan Nordahl, Elita Boone, NP    Family History Family History  Problem Relation Age of Onset   Rheum arthritis Mother    Hypothyroidism Maternal Grandmother     Social History Social History   Tobacco Use   Smoking status: Never   Smokeless tobacco: Never  Vaping Use   Vaping status: Never Used  Substance Use Topics   Alcohol use: Not Currently    Comment: social    Drug use: Not Currently    Types: Marijuana     Allergies   Patient has no known allergies.   Review of Systems Review of Systems   Physical Exam Triage Vital Signs ED Triage Vitals [04/25/23 1523]  Encounter Vitals Group     BP      Systolic BP Percentile      Diastolic BP Percentile  Pulse      Resp      Temp      Temp src      SpO2      Weight      Height      Head Circumference      Peak Flow      Pain Score 0     Pain Loc      Pain Education      Exclude from Growth Chart    No data found.  Updated Vital Signs LMP 02/21/2023 (Approximate)   Visual Acuity Right Eye Distance:   Left Eye Distance:   Bilateral Distance:    Right Eye Near:   Left Eye Near:    Bilateral Near:     Physical Exam Constitutional:      Appearance: Normal appearance.  Eyes:     Extraocular Movements: Extraocular movements intact.  Pulmonary:     Effort: Pulmonary effort is normal.  Abdominal:     General: Abdomen is flat. Bowel sounds are normal.     Palpations: Abdomen is soft.     Tenderness: There is no right CVA tenderness or left CVA  tenderness.  Genitourinary:    Comments: deferred Neurological:     Mental Status: She is alert.      UC Treatments / Results  Labs (all labs ordered are listed, but only abnormal results are displayed) Labs Reviewed  POCT URINALYSIS DIP (MANUAL ENTRY)  CERVICOVAGINAL ANCILLARY ONLY    EKG   Radiology No results found.  Procedures Procedures (including critical care time)  Medications Ordered in UC Medications - No data to display  Initial Impression / Assessment and Plan / UC Course  I have reviewed the triage vital signs and the nursing notes.  Pertinent labs & imaging results that were available during my care of the patient were reviewed by me and considered in my medical decision making (see chart for details).  Vaginal Discharge  Urinalysis negative, discussed findings, treating prophylactically for yeast, Diflucan sent to pharmacy, IUD in place, no concern for pregnancy, STI labs pending will treat per protocol, advised abstinence until lab results, and/or treatment is complete, advised condom use during all sexual encounters moving, may follow-up with urgent care as needed  Final Clinical Impressions(s) / UC Diagnoses   Final diagnoses:  Vaginal discharge     Discharge Instructions      Today you are being treated prophylactically for yeast.   Urinalysis negative for bladder infection  Take diflucan 150 mg once, if symptoms still present in 3 days then you may take second pill   Yeast infections which are caused by a naturally occurring fungus called candida. Vaginosis is an inflammation of the vagina that can result in discharge, itching and pain. The cause is usually a change in the normal balance of vaginal bacteria or an infection. Vaginosis can also result from reduced estrogen levels after menopause.  Labs pending 2-3 days, you will be contacted if positive for any sti and treatment will be sent to the pharmacy, you will have to return to the  clinic if positive for gonorrhea to receive treatment   Please refrain from having sex until labs results, if positive please refrain from having sex until treatment complete and symptoms resolve   If positive for , Chlamydia  gonorrhea or trichomoniasis please notify partner or partners so they may tested as well  Moving forward, it is recommended you use some form of protection against the transmission of  sti infections  such as condoms or dental dams with each sexual encounter    In addition:   Avoid baths, hot tubs and whirlpool spas.  Don't use scented or harsh soaps, such as those with deodorant or antibacterial action. Avoid irritants. These include scented tampons and pads. Wipe from front to back after using the toilet.  Don't douche. Your vagina doesn't require cleansing other than normal bathing.  Use a  condom. Wear cotton underwear, this fabric helps absorb moisture     ED Prescriptions     Medication Sig Dispense Auth. Provider   fluconazole (DIFLUCAN) 150 MG tablet Take 1 tablet (150 mg total) by mouth every 3 (three) days for 2 doses. 2 tablet Valinda Hoar, NP      PDMP not reviewed this encounter.   Valinda Hoar, NP 04/25/23 1547

## 2023-04-25 NOTE — Discharge Instructions (Addendum)
Today you are being treated prophylactically for yeast.   Urinalysis negative for bladder infection  Take diflucan 150 mg once, if symptoms still present in 3 days then you may take second pill   Yeast infections which are caused by a naturally occurring fungus called candida. Vaginosis is an inflammation of the vagina that can result in discharge, itching and pain. The cause is usually a change in the normal balance of vaginal bacteria or an infection. Vaginosis can also result from reduced estrogen levels after menopause.  Labs pending 2-3 days, you will be contacted if positive for any sti and treatment will be sent to the pharmacy, you will have to return to the clinic if positive for gonorrhea to receive treatment   Please refrain from having sex until labs results, if positive please refrain from having sex until treatment complete and symptoms resolve   If positive for , Chlamydia  gonorrhea or trichomoniasis please notify partner or partners so they may tested as well  Moving forward, it is recommended you use some form of protection against the transmission of sti infections  such as condoms or dental dams with each sexual encounter    In addition:   Avoid baths, hot tubs and whirlpool spas.  Don't use scented or harsh soaps, such as those with deodorant or antibacterial action. Avoid irritants. These include scented tampons and pads. Wipe from front to back after using the toilet.  Don't douche. Your vagina doesn't require cleansing other than normal bathing.  Use a  condom. Wear cotton underwear, this fabric helps absorb moisture

## 2023-04-27 LAB — CERVICOVAGINAL ANCILLARY ONLY
Bacterial Vaginitis (gardnerella): NEGATIVE
Candida Glabrata: NEGATIVE
Candida Vaginitis: POSITIVE — AB
Chlamydia: NEGATIVE
Comment: NEGATIVE
Comment: NEGATIVE
Comment: NEGATIVE
Comment: NEGATIVE
Comment: NEGATIVE
Comment: NORMAL
Neisseria Gonorrhea: NEGATIVE
Trichomonas: NEGATIVE

## 2023-07-22 ENCOUNTER — Ambulatory Visit: Payer: Medicaid Other | Admitting: Family Medicine

## 2023-07-24 ENCOUNTER — Encounter: Payer: Self-pay | Admitting: Family Medicine

## 2023-07-24 ENCOUNTER — Ambulatory Visit
Admission: RE | Admit: 2023-07-24 | Discharge: 2023-07-24 | Disposition: A | Discharge status: Home / Self Care | Payer: Medicaid Other | Attending: Family Medicine | Admitting: Family Medicine

## 2023-07-24 ENCOUNTER — Ambulatory Visit
Admission: RE | Admit: 2023-07-24 | Discharge: 2023-07-24 | Disposition: A | Discharge status: Home / Self Care | Payer: Medicaid Other | Source: Ambulatory Visit | Attending: Family Medicine | Admitting: Family Medicine

## 2023-07-24 ENCOUNTER — Emergency Department
Admission: EM | Admit: 2023-07-24 | Discharge: 2023-07-24 | Disposition: A | Discharge status: Home / Self Care | Payer: Medicaid Other | Attending: Emergency Medicine | Admitting: Emergency Medicine

## 2023-07-24 ENCOUNTER — Other Ambulatory Visit: Payer: Self-pay

## 2023-07-24 ENCOUNTER — Ambulatory Visit: Payer: Self-pay | Admitting: Family Medicine

## 2023-07-24 ENCOUNTER — Ambulatory Visit (INDEPENDENT_AMBULATORY_CARE_PROVIDER_SITE_OTHER): Payer: Medicaid Other | Admitting: Family Medicine

## 2023-07-24 VITALS — BP 109/76 | HR 88 | Ht 66.0 in | Wt 108.6 lb

## 2023-07-24 DIAGNOSIS — R634 Abnormal weight loss: Secondary | ICD-10-CM

## 2023-07-24 DIAGNOSIS — E0590 Thyrotoxicosis, unspecified without thyrotoxic crisis or storm: Secondary | ICD-10-CM | POA: Insufficient documentation

## 2023-07-24 DIAGNOSIS — J45909 Unspecified asthma, uncomplicated: Secondary | ICD-10-CM | POA: Diagnosis not present

## 2023-07-24 DIAGNOSIS — R251 Tremor, unspecified: Secondary | ICD-10-CM

## 2023-07-24 DIAGNOSIS — R4589 Other symptoms and signs involving emotional state: Secondary | ICD-10-CM

## 2023-07-24 DIAGNOSIS — R300 Dysuria: Secondary | ICD-10-CM

## 2023-07-24 DIAGNOSIS — G4700 Insomnia, unspecified: Secondary | ICD-10-CM | POA: Insufficient documentation

## 2023-07-24 DIAGNOSIS — E039 Hypothyroidism, unspecified: Secondary | ICD-10-CM | POA: Insufficient documentation

## 2023-07-24 DIAGNOSIS — F419 Anxiety disorder, unspecified: Secondary | ICD-10-CM | POA: Diagnosis not present

## 2023-07-24 DIAGNOSIS — F5101 Primary insomnia: Secondary | ICD-10-CM

## 2023-07-24 LAB — COMPREHENSIVE METABOLIC PANEL
ALT: 23 U/L (ref 0–44)
AST: 22 U/L (ref 15–41)
Albumin: 4.5 g/dL (ref 3.5–5.0)
Alkaline Phosphatase: 51 U/L (ref 38–126)
Anion gap: 9 (ref 5–15)
BUN: 11 mg/dL (ref 6–20)
CO2: 26 mmol/L (ref 22–32)
Calcium: 9.8 mg/dL (ref 8.9–10.3)
Chloride: 103 mmol/L (ref 98–111)
Creatinine, Ser: 0.63 mg/dL (ref 0.44–1.00)
GFR, Estimated: >60 mL/min (ref >60)
Glucose, Bld: 83 mg/dL (ref 70–99)
Potassium: 4 mmol/L (ref 3.5–5.1)
Sodium: 138 mmol/L (ref 135–145)
Total Bilirubin: 1.3 mg/dL — ABNORMAL HIGH (ref 0.0–1.2)
Total Protein: 8.1 g/dL (ref 6.5–8.1)

## 2023-07-24 LAB — CBC WITH DIFFERENTIAL/PLATELET
Abs Immature Granulocytes: 0.01 10*3/uL (ref 0.00–0.07)
Basophils Absolute: 0 10*3/uL (ref 0.0–0.1)
Basophils Absolute: 0 10*3/uL (ref 0.0–0.2)
Basophils Relative: 1 %
Basos: 1 %
EOS (ABSOLUTE): 0.1 10*3/uL (ref 0.0–0.4)
Eos: 1 %
Eosinophils Absolute: 0 10*3/uL (ref 0.0–0.5)
Eosinophils Relative: 1 %
HCT: 44.4 % (ref 36.0–46.0)
Hematocrit: 44.7 % (ref 34.0–46.6)
Hemoglobin: 15 g/dL (ref 12.0–15.0)
Hemoglobin: 14.6 g/dL (ref 11.1–15.9)
Immature Grans (Abs): 0 10*3/uL (ref 0.0–0.1)
Immature Granulocytes: 0 %
Immature Granulocytes: 0 %
Lymphocytes Absolute: 2 10*3/uL (ref 0.7–3.1)
Lymphocytes Relative: 36 %
Lymphs Abs: 2.2 10*3/uL (ref 0.7–4.0)
Lymphs: 32 %
MCH: 29 pg (ref 26.0–34.0)
MCH: 29.1 pg (ref 26.6–33.0)
MCHC: 33.8 g/dL (ref 30.0–36.0)
MCHC: 32.7 g/dL (ref 31.5–35.7)
MCV: 85.9 fL (ref 80.0–100.0)
MCV: 89 fL (ref 79–97)
Monocytes Absolute: 0.3 10*3/uL (ref 0.1–1.0)
Monocytes Absolute: 0.4 10*3/uL (ref 0.1–0.9)
Monocytes Relative: 5 %
Monocytes: 6 %
Neutro Abs: 3.6 10*3/uL (ref 1.7–7.7)
Neutrophils Absolute: 3.7 10*3/uL (ref 1.4–7.0)
Neutrophils Relative %: 57 %
Neutrophils: 60 %
Platelets: 287 10*3/uL (ref 150–400)
Platelets: 283 10*3/uL (ref 150–450)
RBC: 5.17 MIL/uL — ABNORMAL HIGH (ref 3.87–5.11)
RBC: 5.01 x10E6/uL (ref 3.77–5.28)
RDW: 12.4 % (ref 11.5–15.5)
RDW: 12.6 % (ref 11.7–15.4)
WBC: 6.3 10*3/uL (ref 4.0–10.5)
WBC: 6.2 10*3/uL (ref 3.4–10.8)
nRBC: 0 % (ref 0.0–0.2)

## 2023-07-24 LAB — POCT URINALYSIS DIP (CLINITEK)
Bilirubin, UA: NEGATIVE
Blood, UA: NEGATIVE
Glucose, UA: NEGATIVE mg/dL
Leukocytes, UA: NEGATIVE
Nitrite, UA: NEGATIVE
Spec Grav, UA: 1.02 (ref 1.010–1.025)
Urobilinogen, UA: 1 U/dL
pH, UA: 7 (ref 5.0–8.0)

## 2023-07-24 LAB — CMP14+EGFR
ALT: 18 [IU]/L (ref 0–32)
AST: 19 [IU]/L (ref 0–40)
Albumin: 4.6 g/dL (ref 4.0–5.0)
Alkaline Phosphatase: 64 [IU]/L (ref 44–121)
BUN/Creatinine Ratio: 13 (ref 9–23)
BUN: 10 mg/dL (ref 6–20)
Bilirubin Total: 1.2 mg/dL (ref 0.0–1.2)
CO2: 25 mmol/L (ref 20–29)
Calcium: 9.7 mg/dL (ref 8.7–10.2)
Chloride: 103 mmol/L (ref 96–106)
Creatinine, Ser: 0.76 mg/dL (ref 0.57–1.00)
Globulin, Total: 2.5 g/dL (ref 1.5–4.5)
Glucose: 86 mg/dL (ref 70–99)
Potassium: 4.7 mmol/L (ref 3.5–5.2)
Sodium: 141 mmol/L (ref 134–144)
Total Protein: 7.1 g/dL (ref 6.0–8.5)
eGFR: 110 mL/min/{1.73_m2} (ref >59)

## 2023-07-24 LAB — T4, FREE: Free T4: 1 ng/dL (ref 0.61–1.12)

## 2023-07-24 LAB — TSH: TSH: 0.317 u[IU]/mL — ABNORMAL LOW (ref 0.350–4.500)

## 2023-07-24 LAB — TSH+FREE T4
Free T4: 1.54 ng/dL (ref 0.82–1.77)
TSH: 0.301 u[IU]/mL — ABNORMAL LOW (ref 0.450–4.500)

## 2023-07-24 NOTE — Discharge Instructions (Addendum)
 Your exam is reassuring at this time.  You thankfully, did not meet criteria for thyroid storm during his presentation.  Your TSH is low but your T4 thyroid hormone is normal.  This gives your subclinical hypothyroid diagnosis.  No indication for medical management at this time.  You should follow-up with your primary provider for ongoing evaluation and management.  Consider starting on an antianxiolytic that may help with anxiety and may have the benefit or side effect of weight gain.  Return to the ED if needed.

## 2023-07-24 NOTE — ED Notes (Signed)
 Pt not in room or bathroom upon discharging at this time.

## 2023-07-24 NOTE — Telephone Encounter (Signed)
 [facility], calling with stat lab results stating all results are normal aside from TSH result of 0.301. Routing to provider for review.   Copied from CRM [id number]. Topic: Clinical - Lab/Test Results >> Jul 24, 2023  2:23 PM Yolanda T wrote: Reason for CRM: Tia from [facility] calling for stat results Reason for Disposition  Lab or radiology calling with test results  Answer Assessment - Initial Assessment Questions 1. REASON FOR CALL or QUESTION: "What is your reason for calling today?" or "How can I best help you?" or "What question do you have that I can help answer?"     Calling with STAT lab results 2. CALLER: Document the source of call. (e.g., laboratory, patient).     Tia with [facility]  Protocols used: PCP Call - No Triage-A-AH

## 2023-07-24 NOTE — Assessment & Plan Note (Addendum)
 Most likely caused by uncontrolled hyperthyroidism.  Checking CBC, CMP, UA.  Checking CXR.

## 2023-07-24 NOTE — Assessment & Plan Note (Addendum)
 Chart shows she has hyperthyroidism.  She has been off her medication for 2 years.  Documented 42 pound weight loss.  Is tremulous, highly anxious, has palpitations.  Skin feels feverish.  Concerned for thyroid storm.  Asked her to go to the ER and she agreed.

## 2023-07-24 NOTE — Assessment & Plan Note (Signed)
 Consider remeron to help her rest.  May stimulate her appetite as well.  Prozac is also a good choice to stimulate appetite but no benefit of helping her sleep.

## 2023-07-24 NOTE — Progress Notes (Signed)
 Established Patient Office Visit  Subjective   Patient ID: Cindy Brock, female    DOB: July 22, 1995  Age: 28 y.o. MRN: [id number]  Chief Complaint  Patient presents with   Establish Care    Establish care , unexplained weight loss , IUD questions\removal     HPI  28 yo female, mother of 4, with history of hyperthyroidism who has not taken her thyroid medication in 2 years.  Documented 42 pound weight loss in 12 months.  Saw an endocrinologist and got her levels correct about 3 years ago.  Stopped taking her medication because her levels were good.  Got pregnant with her twins and did take thyroid medication while pregnant.  After delivery did not think she needed medication any longer and stopped taking it..      She reports she feels terrible.  She cannot sleep more than about 2 hours.  She has to take melatonin 5 mg in order to go to sleep.  She wakes up and she is very worried about her children and cannot go back to sleep.  She has an IUD and ask that I remove this because she thinks it may be why she feels bad,        She has no appetite.  She snacks on fruits, usually bananas, during the day.  She cooks for her children but feels nauseated if she tries to eat more than a couple of bites.  Sometimes she does vomit if she eats more than a couple of bites.  She denies heartburn, abdominal pain, diarrhea, constipation, BRBPR, melena.  She has never been to GI or had EGD.  No pain with swallowing.  She has never felt that food gets stuck in her throat.        At times she feels palpitations but denies associated chest pain, shortness of breath, diaphoresis or syncope.  She has never had swollen ankles.  She denies PND and orthopnea. 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  Her husband works the same but they work on different days and this way do not need a Dispensing optician.      ROS    Objective:     BP 109/76 (BP Location: Left Arm, Patient Position: Sitting, Cuff  Size: Normal)   Pulse 88   Ht 5\' 6"  (1.676 m)   Wt 108 lb 9.6 oz (49.3 kg)   SpO2 97%   BMI 17.53 kg/m    Physical Exam Vitals and nursing note reviewed. Exam conducted with a chaperone present.  Constitutional:      Appearance: Normal appearance.  HENT:     Head: Normocephalic and atraumatic.     Right Ear: Tympanic membrane and external ear normal.     Left Ear: Tympanic membrane and external ear normal.     Mouth/Throat:     Mouth: Mucous membranes are moist.     Pharynx: Oropharynx is clear. No oropharyngeal exudate or posterior oropharyngeal erythema.  Eyes:     Conjunctiva/sclera: Conjunctivae normal.  Neck:     Thyroid: No thyroid mass, thyromegaly or thyroid tenderness.  Cardiovascular:     Rate and Rhythm: Normal rate and regular rhythm.  Pulmonary:     Effort: Pulmonary effort is normal.     Breath sounds: Normal breath sounds.  Chest:     Chest wall: No mass.  Breasts:    Right: Normal. No mass, nipple discharge or skin change.     Left: Normal. No mass,  nipple discharge or skin change.  Musculoskeletal:     Cervical back: Neck supple.     Right lower leg: No edema.     Left lower leg: No edema.  Lymphadenopathy:     Upper Body:     Right upper body: No supraclavicular, axillary or pectoral adenopathy.     Left upper body: No supraclavicular, axillary or pectoral adenopathy.  Skin:    General: Skin is warm and dry.  Neurological:     Mental Status: She is alert and oriented to person, place, and time.  Psychiatric:        Mood and Affect: Mood normal.        Behavior: Behavior normal.        Thought Content: Thought content normal.        Judgment: Judgment normal.      Results for orders placed or performed during the hospital encounter of 07/24/23  TSH  Result Value Ref Range   TSH 0.317 (L) 0.350 - 4.500 uIU/mL  T4, free  Result Value Ref Range   Free T4 1.00 0.61 - 1.12 ng/dL  CBC with Differential  Result Value Ref Range   WBC 6.3 4.0 -  10.5 K/uL   RBC 5.17 (H) 3.87 - 5.11 MIL/uL   Hemoglobin 15.0 12.0 - 15.0 g/dL   HCT 35.4 65.6 - 81.2 %   MCV 85.9 80.0 - 100.0 fL   MCH 29.0 26.0 - 34.0 pg   MCHC 33.8 30.0 - 36.0 g/dL   RDW 75.1 70.0 - 17.4 %   Platelets 287 150 - 400 K/uL   nRBC 0.0 0.0 - 0.2 %   Neutrophils Relative % 57 %   Neutro Abs 3.6 1.7 - 7.7 K/uL   Lymphocytes Relative 36 %   Lymphs Abs 2.2 0.7 - 4.0 K/uL   Monocytes Relative 5 %   Monocytes Absolute 0.3 0.1 - 1.0 K/uL   Eosinophils Relative 1 %   Eosinophils Absolute 0.0 0.0 - 0.5 K/uL   Basophils Relative 1 %   Basophils Absolute 0.0 0.0 - 0.1 K/uL   Immature Granulocytes 0 %   Abs Immature Granulocytes 0.01 0.00 - 0.07 K/uL  Comprehensive metabolic panel  Result Value Ref Range   Sodium 138 135 - 145 mmol/L   Potassium 4.0 3.5 - 5.1 mmol/L   Chloride 103 98 - 111 mmol/L   CO2 26 22 - 32 mmol/L   Glucose, Bld 83 70 - 99 mg/dL   BUN 11 6 - 20 mg/dL   Creatinine, Ser 9.44 0.44 - 1.00 mg/dL   Calcium 9.8 8.9 - 96.7 mg/dL   Total Protein 8.1 6.5 - 8.1 g/dL   Albumin 4.5 3.5 - 5.0 g/dL   AST 22 15 - 41 U/L   ALT 23 0 - 44 U/L   Alkaline Phosphatase 51 38 - 126 U/L   Total Bilirubin 1.3 (H) 0.0 - 1.2 mg/dL   GFR, Estimated >59 >16 mL/min   Anion gap 9 5 - 15  Results for orders placed or performed in visit on 07/24/23  CBC with Differential  Result Value Ref Range   WBC 6.2 3.4 - 10.8 x10E3/uL   RBC 5.01 3.77 - 5.28 x10E6/uL   Hemoglobin 14.6 11.1 - 15.9 g/dL   Hematocrit 38.4 66.5 - 46.6 %   MCV 89 79 - 97 fL   MCH 29.1 26.6 - 33.0 pg   MCHC 32.7 31.5 - 35.7 g/dL   RDW 99.3 57.0 - 17.7 %  Platelets 283 150 - 450 x10E3/uL   Neutrophils 60 Not Estab. %   Lymphs 32 Not Estab. %   Monocytes 6 Not Estab. %   Eos 1 Not Estab. %   Basos 1 Not Estab. %   Neutrophils Absolute 3.7 1.4 - 7.0 x10E3/uL   Lymphocytes Absolute 2.0 0.7 - 3.1 x10E3/uL   Monocytes Absolute 0.4 0.1 - 0.9 x10E3/uL   EOS (ABSOLUTE) 0.1 0.0 - 0.4 x10E3/uL   Basophils  Absolute 0.0 0.0 - 0.2 x10E3/uL   Immature Granulocytes 0 Not Estab. %   Immature Grans (Abs) 0.0 0.0 - 0.1 x10E3/uL  CMP14+EGFR  Result Value Ref Range   Glucose 86 70 - 99 mg/dL   BUN 10 6 - 20 mg/dL   Creatinine, Ser 4.09 0.57 - 1.00 mg/dL   eGFR 811 >91 YN/WGN/5.62   BUN/Creatinine Ratio 13 9 - 23   Sodium 141 134 - 144 mmol/L   Potassium 4.7 3.5 - 5.2 mmol/L   Chloride 103 96 - 106 mmol/L   CO2 25 20 - 29 mmol/L   Calcium 9.7 8.7 - 10.2 mg/dL   Total Protein 7.1 6.0 - 8.5 g/dL   Albumin 4.6 4.0 - 5.0 g/dL   Globulin, Total 2.5 1.5 - 4.5 g/dL   Bilirubin Total 1.2 0.0 - 1.2 mg/dL   Alkaline Phosphatase 64 44 - 121 IU/L   AST 19 0 - 40 IU/L   ALT 18 0 - 32 IU/L  TSH + free T4  Result Value Ref Range   TSH 0.301 (L) 0.450 - 4.500 uIU/mL   Free T4 1.54 0.82 - 1.77 ng/dL  POCT URINALYSIS DIP (CLINITEK)  Result Value Ref Range   Color, UA yellow yellow   Clarity, UA clear clear   Glucose, UA negative negative mg/dL   Bilirubin, UA negative negative   Ketones, POC UA trace (5) (A) negative mg/dL   Spec Grav, UA 1.308 6.578 - 1.025   Blood, UA negative negative   pH, UA 7.0 5.0 - 8.0   POC PROTEIN,UA trace negative, trace   Urobilinogen, UA 1.0 0.2 or 1.0 E.U./dL   Nitrite, UA Negative Negative   Leukocytes, UA Negative Negative      The ASCVD Risk score (Arnett DK, et al., 2019) failed to calculate for the following reasons:   The 2019 ASCVD risk score is only valid for ages 53 to 79    Assessment & Plan:   Problem List Items Addressed This Visit       Endocrine   Thyrotoxicosis   Chart shows she has hyperthyroidism.  She has been off her medication for 2 years.  Documented 42 pound weight loss.  Is tremulous, highly anxious, has palpitations.  Skin feels feverish.  Concerned for thyroid storm.  Asked her to go to the ER and she agreed.          Other   Unintentional weight loss   Most likely caused by uncontrolled hyperthyroidism.  Checking CBC, CMP, UA.   Checking CXR.        Relevant Orders   DG Chest 2 View (Completed)   CBC with Differential (Completed)   CMP14+EGFR (Completed)   TSH + free T4 (Completed)   Insomnia   Consider remeron to help her rest.  May stimulate her appetite as well.  Prozac is also a good choice to stimulate appetite but no benefit of helping her sleep.        Anxiety   Relevant Orders   Ambulatory  referral to Psychiatry   Tremor   Thoughts of self harm   Anxiety is severe.  Punched herself in the eye recently and had a black eye.  Had thoughts of self harm when she lived in [place] as well.  Both parents have psychiatric illness per patient but she does not know their diagnoses.  Never taken any medication for anxiety before.   Advised that if she does not feel safe for herself or her children, she can always go to any ER.  They are open 24 hours a day and can get her urgent psychiatric assessment.  Denies that she is a danger to herself or her children.  Needs urgent referral to psychiatry.  Klonopin 1mg  BID to TID.        Other Visit Diagnoses       Acquired hypothyroidism    -  Primary   Relevant Orders   TSH + free T4 (Completed)     Dysuria       Relevant Orders   POCT URINALYSIS DIP (CLINITEK) (Completed)       No follow-ups on file.    Alease Medina, MD

## 2023-07-24 NOTE — ED Triage Notes (Signed)
 Patient reports she has been having weight loss, delirium, and nausea if she eats. Reports she was sent by her PCP for thyroid storm.   NAD noted in triage

## 2023-07-24 NOTE — ED Provider Notes (Signed)
 [facility] - Inpatient Emergency Department Provider Note     Event Date/Time   First MD Initiated Contact with Patient 07/24/23 1519     (approximate)   History   Hyperthyroidism   HPI  Cindy Brock is a 28 y.o. female with a history of hypothyroidism, anxiety, asthma, presents to the ED following an initial evaluation with her newly selected primary provider.  Patient with a history of hypothyroidism not currently on medication management, presents with several months of intermittent symptoms including unintended weight loss, anxiety and nausea if she eats certain foods.  Also would report anorexia, and having to remind herself to eat during the day.  Patient was seen on evaluation by her primary provider, and was told after labs were reported, to report emergently to the ED for concern for thyroid storm.  The patient presents in no acute distress.  She denies any fevers, chills, sweats, chest pain, or shortness of breath.  Chart review reveals the patient previously treated during pregnancy 7 to 3 years prior, but therapy was discontinued after her thyroid hormones normalized.  Physical Exam   Triage Vital Signs: ED Triage Vitals  Encounter Vitals Group     BP 07/24/23 1230 (!) 128/94     Systolic BP Percentile --      Diastolic BP Percentile --      Pulse Rate 07/24/23 1230 81     Resp 07/24/23 1230 18     Temp 07/24/23 1230 98.2 F (36.8 C)     Temp Source 07/24/23 1230 Oral     SpO2 07/24/23 1230 100 %     Weight 07/24/23 1231 108 lb (49 kg)     Height 07/24/23 1231 5\' 6"  (1.676 m)     Head Circumference --      Peak Flow --      Pain Score 07/24/23 1231 0     Pain Loc --      Pain Education --      Exclude from Growth Chart --     Most recent vital signs: Vitals:   07/24/23 1230  BP: (!) 128/94  Pulse: 81  Resp: 18  Temp: 98.2 F (36.8 C)  SpO2: 100%    General Awake, no distress. NAD HEENT NCAT. PERRL. EOMI. No rhinorrhea. Mucous  membranes are moist.  Neck is supple with a nonpalpable thyroid at this time. CV:  Good peripheral perfusion. RRR no tachycardia or, murmur, rub, or gallop noted. RESP:  Normal effort. CTA ABD:  No distention.  NEURO: Cranial nerves II to XII grossly intact.   ED Results / Procedures / Treatments   Labs (all labs ordered are listed, but only abnormal results are displayed) Labs Reviewed  TSH - Abnormal; Notable for the following components:      Result Value   TSH 0.317 (*)    All other components within normal limits  CBC WITH DIFFERENTIAL/PLATELET - Abnormal; Notable for the following components:   RBC 5.17 (*)    All other components within normal limits  COMPREHENSIVE METABOLIC PANEL - Abnormal; Notable for the following components:   Total Bilirubin 1.3 (*)    All other components within normal limits  T4, FREE     EKG   RADIOLOGY  I personally viewed and evaluated these images as part of my medical decision making, as well as reviewing the written report by the radiologist.  ED Provider Interpretation: no acute findings  DG Chest 2 View Result Date: 07/24/2023 CLINICAL DATA:  Unintentional weight loss. EXAM: CHEST - 2 VIEW COMPARISON:  None Available. FINDINGS: The heart size and mediastinal contours are within normal limits. Both lungs are clear. The visualized skeletal structures are unremarkable. IMPRESSION: No active cardiopulmonary disease. Electronically Signed   By: Lupita Raider M.D.   On: 07/24/2023 12:25     PROCEDURES:  Critical Care performed: No  Procedures   MEDICATIONS ORDERED IN ED: Medications - No data to display   IMPRESSION / MDM / ASSESSMENT AND PLAN / ED COURSE  I reviewed the triage vital signs and the nursing notes.                              Differential diagnosis includes, but is not limited to, hyperthyroidism, thyrotoxicosis, thyroid storm, Graves' disease, electrolyte abnormality, anxiety  Patient's presentation is most  consistent with acute complicated illness / injury requiring diagnostic workup.   Patient's diagnosis is consistent with concern for thyroid storm by PCP.  Patient presents in no acute distress for evaluation.  She is afebrile on presentation, with no tachycardia, chest pain, or shortness of breath.  Patient also denies any night sweats, cough, congestion.  Chart review reveals similar labs with PCP.  Labs today show a decreased TSH with a normal T4.  Patient apparently also had a chest x-ray done in outpatient setting today, which does not show any fluid overload or pulmonary edema.  Patient is appropriate at this time with a reassuring mood and affect.  No evidence of anxiety, tremors, or delirium on presentation.  Patient does not meet clinical criteria for thyroid storm at this time.  She does have evidence however, over subclinical hyperthyroid syndrome.  No indication at this time for medication management.  Patient is to follow up with her primary provider as discussed as needed or otherwise directed.  Patient does voice some concerns for weight loss and would like medication for appetite stimulation.  She apparently has a prescription pending for Klonopin which she did not pick up today due to the emergent call to report to the ED.  Patient is advised to consider statin medication as it may alleviate some of her anxiety.  She can also discuss with the primary provider about ongoing management of her symptoms.  I suggest the patient be followed up and have her labs repeated in 2 to 3 months for reevaluation.  Patient is given ED precautions to return to the ED for any worsening or new symptoms.     FINAL CLINICAL IMPRESSION(S) / ED DIAGNOSES   Final diagnoses:  Subclinical hyperthyroidism     Rx / DC Orders   ED Discharge Orders     None        Note:  This document was prepared using Dragon voice recognition software and may include unintentional dictation errors.    Lissa Hoard, PA-C 07/24/23 2148    Jene Every, MD 07/24/23 2248

## 2023-07-24 NOTE — Assessment & Plan Note (Addendum)
 Anxiety is severe.  Punched herself in the eye recently and had a black eye.  Had thoughts of self harm when she lived in [place] as well.  Both parents have psychiatric illness per patient but she does not know their diagnoses.  Never taken any medication for anxiety before.   Advised that if she does not feel safe for herself or her children, she can always go to any ER.  They are open 24 hours a day and can get her urgent psychiatric assessment.  Denies that she is a danger to herself or her children.  Needs urgent referral to psychiatry.  Klonopin 1mg  BID to TID.

## 2023-07-25 ENCOUNTER — Encounter: Payer: Self-pay | Admitting: Family Medicine

## 2023-07-25 MED ORDER — CLONAZEPAM 0.5 MG PO TABS: 0.5000 mg | ORAL_TABLET | Freq: Two times a day (BID) | ORAL | 0 refills | Status: AC | PRN

## 2023-07-25 MED ORDER — MIRTAZAPINE 15 MG PO TBDP: 15.0000 mg | ORAL_TABLET | Freq: Every day | ORAL | 3 refills | Status: AC

## 2023-07-25 NOTE — Addendum Note (Signed)
 Addended by: Alease Medina on: 07/25/2023 11:54 AM   Modules accepted: Orders

## 2023-08-05 ENCOUNTER — Other Ambulatory Visit: Payer: Self-pay | Admitting: Family Medicine

## 2023-09-08 ENCOUNTER — Ambulatory Visit: Admitting: Psychiatry

## 2023-09-14 ENCOUNTER — Ambulatory Visit (INDEPENDENT_AMBULATORY_CARE_PROVIDER_SITE_OTHER): Admitting: Psychiatry

## 2023-09-14 ENCOUNTER — Encounter: Payer: Self-pay | Admitting: Psychiatry

## 2023-09-14 ENCOUNTER — Telehealth: Admitting: Physician Assistant

## 2023-09-14 VITALS — BP 118/84 | HR 116 | Temp 98.5°F | Ht 66.0 in | Wt 129.6 lb

## 2023-09-14 DIAGNOSIS — J4520 Mild intermittent asthma, uncomplicated: Secondary | ICD-10-CM | POA: Diagnosis not present

## 2023-09-14 DIAGNOSIS — F3181 Bipolar II disorder: Secondary | ICD-10-CM

## 2023-09-14 MED ORDER — ALBUTEROL SULFATE HFA 108 (90 BASE) MCG/ACT IN AERS: 1.0000 | INHALATION_SPRAY | Freq: Four times a day (QID) | RESPIRATORY_TRACT | 0 refills | Status: DC | PRN

## 2023-09-14 MED ORDER — ARIPIPRAZOLE 5 MG PO TABS: 5.0000 mg | ORAL_TABLET | Freq: Every day | ORAL | 0 refills | Status: AC

## 2023-09-14 NOTE — Progress Notes (Signed)

## 2023-09-14 NOTE — Progress Notes (Cosign Needed)
 Psychiatric Initial Adult Assessment   Patient Identification: Cindy Brock MRN:  [id number] Date of Evaluation:  09/14/2023 Referral Source: Doran Heater MD.  Chief Complaint:   Chief Complaint  Patient presents with   Establish Care   Visit Diagnosis:    ICD-10-CM   1. Bipolar 2 disorder (HCC)  F31.81       History of Present Illness: 28 year old female presenting to [facility] for establishing care and medication management.  Patient reports that she has been taking Klonopin for anxiety but believes that she is having bipolar issues.  Patient reports that she went to [employer] and states that she has had elevated behavior is highly irritable and is super productive right now.  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  Patient reports that this is of concern with her husband pointing out that she may have bipolar issues in which she is coming to be evaluated.  Patient was symptoms of elevated and expansive irritable mood, insomnia, impulsive behavior, grandiosity, flight of ideas and racing thoughts.  Patient has never had a history of bipolar being treated with.  Patient currently denies SI, HI, AVH.  Based on this assessment interview it is recommended for the patient be diagnosed with bipolar type II disorder based on hypomanic like behaviors in which she should will start taking Abilify 5 mg once a day for 2 weeks with a 2-week follow-up to monitor medication effect and school adjustment.  She was educated on the symptoms of bipolar disorder and reassured that medication management can help promote stability.  Which the patient is in agreement with.  Patient with no other needs or concerns.  Patient reports that she stopped taking Klonopin due to not having any kind  of effect but significant side effects and currently takes mirtazapine per her PCP for weight gain and insomnia.  Patient with no other questions or concerns at this time.  Associated Signs/Symptoms: Depression Symptoms:  insomnia, (Hypo) Manic Symptoms:  Distractibility, Elevated Mood, Flight of Ideas, Impulsivity, Irritable Mood, Anxiety Symptoms:  Denies Psychotic Symptoms:  Denies PTSD Symptoms: Denies  Past Psychiatric History: Previous Psych Hospitalizations: 17, hospitalized for suicidal ideation, none since  Outpatient treatment: Currently being managed by Dr. Girtha Rm and may have been for PCP.  No active psychiatrist.  Medications Current: -Klonopin, which she reports she stopped taking -Mirtazapine, taking for sleep by PCP  Medication Trials: -No previous medication trials  Suicide & Violence: Denies SI, HI.  Denies AVH  Psychotherapy: No psychotherapy  Legal: Denies  Previous Psychotropic Medications: Yes   Substance Abuse History in the last 12 months:  No.  Consequences of Substance Abuse: Negative  Past Medical History:  Past Medical History:  Diagnosis Date   Asthma    Hyperthyroidism    Thyroid disease     Past Surgical History:  Procedure Laterality Date   NO PAST SURGERIES      Family Psychiatric History: Father suspicious of bipolar disorder.  Family History:  Family History  Problem Relation Age of Onset   Rheum arthritis Mother    Hypothyroidism Maternal Grandmother     Social History:   Social History   Socioeconomic History   Marital status: Married    Spouse name: Chevaughn  Number of children: 4   Years of education: Not on file   Highest education level: High school graduate  Occupational History   Not on file  Tobacco Use   Smoking status: Some Days    Current packs/day: 0.25    Types: Cigarettes    Passive exposure: Current   Smokeless tobacco: Never  Vaping Use   Vaping status: Never Used  Substance and Sexual  Activity   Alcohol use: Not Currently    Comment: social    Drug use: Not Currently   Sexual activity: Yes    Birth control/protection: I.U.D.    Comment: undecided  Other Topics Concern   Not on file  Social History Narrative   Not on file   Social Drivers of Health   Financial Resource Strain: Not on file  Food Insecurity: Not on file  Transportation Needs: Not on file  Physical Activity: Not on file  Stress: Not on file  Social Connections: Not on file    Additional Social History: No additional social history  Allergies:  No Known Allergies  Metabolic Disorder Labs: Lab Results  Component Value Date   HGBA1C 5.5 10/03/2019   No results found for: "PROLACTIN" No results found for: "CHOL", "TRIG", "HDL", "CHOLHDL", "VLDL", "LDLCALC" Lab Results  Component Value Date   TSH 0.317 (L) 07/24/2023    Therapeutic Level Labs: No results found for: "LITHIUM" No results found for: "CBMZ" No results found for: "VALPROATE"  Current Medications: Current Outpatient Medications  Medication Sig Dispense Refill   albuterol (VENTOLIN HFA) 108 (90 Base) MCG/ACT inhaler Inhale 1-2 puffs into the lungs every 6 (six) hours as needed. 8 g 0   clonazePAM (KLONOPIN) 0.5 MG tablet Take 1 tablet (0.5 mg total) by mouth 2 (two) times daily as needed for anxiety. 30 tablet 0   mirtazapine (REMERON SOL-TAB) 15 MG disintegrating tablet Take 1 tablet (15 mg total) by mouth at bedtime. 30 tablet 3   No current facility-administered medications for this visit.    Musculoskeletal: Strength & Muscle Tone: within normal limits Gait & Station: normal Patient leans: N/A  Psychiatric Specialty Exam: Review of Systems  Constitutional: Negative.   HENT: Negative.    Eyes: Negative.   Respiratory: Negative.    Cardiovascular: Negative.   Gastrointestinal: Negative.   Endocrine: Negative.   Genitourinary: Negative.   Musculoskeletal: Negative.   Skin: Negative.   Allergic/Immunologic:  Negative.   Neurological: Negative.   Hematological: Negative.   Psychiatric/Behavioral:  Positive for behavioral problems. The patient is nervous/anxious and is hyperactive.     Blood pressure 118/84, pulse (!) 116, temperature 98.5 F (36.9 C), temperature source Temporal, height 5\' 6"  (1.676 m), weight 129 lb 9.6 oz (58.8 kg), SpO2 97%.Body mass index is 20.92 kg/m.  General Appearance: Well Groomed  Eye Contact:  Good  Speech:  Clear and Coherent  Volume:  Normal  Mood:  Euphoric  Affect:  Congruent  Thought Process:  Coherent  Orientation:  Full (Time, Place, and Person)  Thought Content:  Logical  Suicidal Thoughts:  No  Homicidal Thoughts:  No  Memory:  Immediate;   Good Recent;   Good Remote;   Good  Judgement:  Good  Insight:  Good  Psychomotor Activity:  Normal  Concentration:  Concentration: Good and Attention Span: Good  Recall:  Good  Fund of Knowledge:Good  Language: Good  Akathisia:  No  Handed:  Right  AIMS (if indicated):    Assets:  Desire for Improvement Social Support  Vocational/Educational  ADL's:  Intact  Cognition: WNL  Sleep:  Good   Screenings: GAD-7    Flowsheet Row Office Visit from 07/24/2023 in [facility] Visit from 01/02/2020 in [facility]  Total GAD-7 Score 20 0      PHQ2-9    Flowsheet Row Office Visit from 07/24/2023 in [facility] at [facility] Routine Prenatal from 09/24/2020 in [facility] Routine Prenatal from 08/23/2020 in [facility] Office Visit from 01/02/2020 in [facility]  PHQ-2 Total Score 6 0 0 0  PHQ-9 Total Score 27 -- -- 0      Flowsheet Row ED from 07/24/2023 in [facility] at [facility] ED from 04/25/2023 in [facility]  ED from 02/19/2023 in [facility] at [facility]   C-SSRS RISK CATEGORY No Risk No Risk No Risk       Assessment and Plan:   Assessment -  Diagnosis: Bipolar 2, currently hypomanic Differential Diagnosis: Bipolar 1 2. Anxiety - Progress: Baseline appointment  - Risk Factors:   Plan - Medications:  Start Abilify 5 mg once a day for bipolar 2, patient educated on GI irritability as well as dizziness.  Patient educated on tardive dyskinesia and to stop medication if she develops any abnormal movements and let the clinic know. Continue mirtazapine 15 mg daily at night before bed.  Prescribed by PCP. - Psychotherapy: None recommended at this time - Education: Patient educated on bipolar disorder and has been educated on the importance of sleep as well as the purpose of Abilify with bipolar disorder..  Patient is currently experiencing hypomanic episodes and patient has been educated on management with sleep schedule as well as exercise.  Patient is in agreement - Follow-Up: Patient will follow up in 2 weeks for medication monitoring and adjustment. - Referrals: No referrals - Safety Planning:  The patient has been educated, if they should have suicidal thoughts with or without a plan to call 911, or go to the closest emergency department.  Pt verbalized understanding.  Pt denies firearms within the home.  Pt also agrees to call the clinic should they have worsening symptoms before the next appointment.      Patient/Guardian was advised Release of Information must be obtained prior to any record release in order to collaborate their care with an outside provider. Patient/Guardian was advised if they have not already done so to contact the registration department to sign all necessary forms in order for us  to release information regarding their care.   Consent: Patient/Guardian gives verbal consent for treatment and assignment of benefits for services provided during this visit. Patient/Guardian expressed understanding and agreed to proceed.   This office note has been dictated. This dictation was prepared using Tree surgeon. As a result, errors may occur. When identified, these errors have been corrected. While every attempt is made to correct errors during dictation, errors may still exist.  Arlana Labor, NP 4/14/20253:33 PM

## 2023-09-29 ENCOUNTER — Ambulatory Visit: Admitting: Psychiatry

## 2023-10-22 ENCOUNTER — Other Ambulatory Visit: Payer: Self-pay

## 2023-10-22 DIAGNOSIS — Z5321 Procedure and treatment not carried out due to patient leaving prior to being seen by health care provider: Secondary | ICD-10-CM | POA: Diagnosis not present

## 2023-10-22 DIAGNOSIS — K0889 Other specified disorders of teeth and supporting structures: Secondary | ICD-10-CM | POA: Diagnosis present

## 2023-10-22 DIAGNOSIS — K047 Periapical abscess without sinus: Secondary | ICD-10-CM | POA: Diagnosis not present

## 2023-10-22 NOTE — ED Triage Notes (Signed)
 Pt to ED via POV c/o dental pain to lower left wisdom tooth. Pt went to dentist today and was told she had an abscess. Was prescribed clindamycin, took one dose earlier. Pt denies any known fevers. Came because she believes she "has sepsis"

## 2023-10-23 ENCOUNTER — Ambulatory Visit: Admitting: Family Medicine

## 2023-10-23 ENCOUNTER — Emergency Department
Admission: EM | Admit: 2023-10-23 | Discharge: 2023-10-23 | Discharge status: Against Medical Advice | Attending: Emergency Medicine | Admitting: Emergency Medicine

## 2023-10-23 NOTE — ED Notes (Signed)
 No answer when called several times from lobby

## 2024-04-12 ENCOUNTER — Ambulatory Visit: Payer: Self-pay

## 2024-04-12 ENCOUNTER — Ambulatory Visit: Admitting: Internal Medicine

## 2024-04-12 NOTE — Telephone Encounter (Signed)
  Message from [place] L sent at 04/12/2024  9:44 AM EST  Summary: diarrhea, body aches   Reason for Triage: Patient requesting appointment. Patient states body has been aching and has had some diarrhea. Best callback number: [phone]

## 2024-04-12 NOTE — Telephone Encounter (Signed)
 FYI Only or Action Required?: FYI only for provider: appointment scheduled on 04/13/24.  Patient was last seen in primary care on 07/24/2023 by Ziglar, Devere POUR, MD.  Called Nurse Triage reporting Diarrhea.  Symptoms began yesterday.  Interventions attempted: Rest, hydration, or home remedies.  Symptoms are: gradually worsening.  Triage Disposition: See Physician Within 24 Hours  Patient/caregiver understands and will follow disposition?:   Reason for Disposition  [1] MODERATE diarrhea (e.g., 4-6 times / day more than normal) AND [2] present > 48 hours (2 days)  Answer Assessment - Initial Assessment Questions Pt experiencing diarrhea. States her toddlers recently had a GI virus.   1. DIARRHEA SEVERITY:      Severe  2. ONSET: When did the diarrhea begin?      Yesterday  3. STOOL DESCRIPTION:  How loose or watery is the diarrhea? What is the stool color? Is there any blood or mucous in the stool?     Like water   4. VOMITING: Are you also vomiting? If Yes, ask: How many times in the past 24 hours?      Denies  5. ABDOMEN PAIN: Are you having any abdomen pain? If Yes, ask: What does it feel like? (e.g., crampy, dull, intermittent, constant)      Cramping  Protocols used: Diarrhea-A-AH

## 2024-04-13 ENCOUNTER — Ambulatory Visit: Admitting: Family Medicine

## 2024-04-13 ENCOUNTER — Encounter: Payer: Self-pay | Admitting: Family Medicine

## 2024-04-15 NOTE — Progress Notes (Signed)
 Patient not seen.

## 2024-05-03 ENCOUNTER — Telehealth: Payer: Self-pay | Admitting: Family Medicine

## 2024-05-03 NOTE — Telephone Encounter (Signed)
 Copied from CRM #[id number]. Topic: Clinical - Request for Lab/Test Order >> May 03, 2024  8:28 AM Kendralyn S wrote: Reason for CRM: needs quantiferon blood test done

## 2024-05-06 ENCOUNTER — Telehealth: Payer: Self-pay | Admitting: Family Medicine

## 2024-05-06 NOTE — Telephone Encounter (Signed)
 Patient is requesting a prescription for Xanax PRN.  Last OV 04/13/24.  Please call Patient

## 2024-05-09 ENCOUNTER — Ambulatory Visit: Admitting: Family Medicine

## 2024-05-09 ENCOUNTER — Encounter: Payer: Self-pay | Admitting: Family Medicine

## 2024-05-09 VITALS — BP 119/77 | HR 76 | Ht 66.0 in | Wt 133.4 lb

## 2024-05-09 DIAGNOSIS — Z111 Encounter for screening for respiratory tuberculosis: Secondary | ICD-10-CM

## 2024-05-09 DIAGNOSIS — F319 Bipolar disorder, unspecified: Secondary | ICD-10-CM

## 2024-05-09 DIAGNOSIS — J4520 Mild intermittent asthma, uncomplicated: Secondary | ICD-10-CM

## 2024-05-09 DIAGNOSIS — F419 Anxiety disorder, unspecified: Secondary | ICD-10-CM

## 2024-05-09 DIAGNOSIS — Z8709 Personal history of other diseases of the respiratory system: Secondary | ICD-10-CM

## 2024-05-09 MED ORDER — HYDROXYZINE HCL 10 MG PO TABS: 10.0000 mg | ORAL_TABLET | Freq: Three times a day (TID) | ORAL | 0 refills | Status: AC | PRN

## 2024-05-09 MED ORDER — ALBUTEROL SULFATE HFA 108 (90 BASE) MCG/ACT IN AERS: 1.0000 | INHALATION_SPRAY | Freq: Four times a day (QID) | RESPIRATORY_TRACT | 0 refills | Status: AC | PRN

## 2024-05-09 NOTE — Progress Notes (Unsigned)
   Established Patient Office Visit  Subjective   Patient ID: Cindy Brock, female    DOB: 02/15/1996  Age: 28 y.o. MRN: [id number]  Chief Complaint  Patient presents with  . Medical Management of Chronic Issues    HPI   {History (Optional):23778}  ROS    Objective:     BP 119/77   Pulse 76   Ht 5' 6 (1.676 m)   Wt 133 lb 6.4 oz (60.5 kg)   SpO2 97%   BMI 21.53 kg/m  {Vitals History (Optional):23777}  Physical Exam ***  {Perform Simple Foot Exam  Perform Detailed exam:1} {Insert foot Exam (Optional):30965}   No results found for any visits on 05/09/24.  {Labs (Optional):23779}  The ASCVD Risk score (Arnett DK, et al., 2019) failed to calculate for the following reasons:   The 2019 ASCVD risk score is only valid for ages 100 to 53    Assessment & Plan:  Screening for tuberculosis -     QuantiFERON-TB Gold Plus  Mild intermittent asthma without complication -     Albuterol  Sulfate HFA; Inhale 1-2 puffs into the lungs every 6 (six) hours as needed.  Dispense: 8 g; Refill: 0  Anxiety -     hydrOXYzine  HCl; Take 1 tablet (10 mg total) by mouth 3 (three) times daily as needed.  Dispense: 30 tablet; Refill: 0     No follow-ups on file.    Alontae Chaloux K Zora Glendenning, MD

## 2024-05-10 DIAGNOSIS — F319 Bipolar disorder, unspecified: Secondary | ICD-10-CM | POA: Insufficient documentation

## 2024-05-10 NOTE — Assessment & Plan Note (Signed)
 Not having any difficulty with her asthma just wants an albuterol  to have.

## 2024-05-10 NOTE — Assessment & Plan Note (Signed)
 Psychiatry saw her and diagnosed her with bipolar gave her Abilify  and mirtazapine .  Needs to follow-up with psychiatrist about anxiety.  Will give her hydroxyzine  10 mg that she can take 3 times daily to help with anxiety until she can get seen by psychiatry.

## 2024-05-11 ENCOUNTER — Ambulatory Visit: Admitting: Psychiatry

## 2024-05-11 ENCOUNTER — Encounter: Payer: Self-pay | Admitting: Psychiatry

## 2024-05-11 VITALS — BP 120/70 | HR 99 | Temp 98.3°F | Ht 66.0 in | Wt 134.4 lb

## 2024-05-11 DIAGNOSIS — F411 Generalized anxiety disorder: Secondary | ICD-10-CM | POA: Diagnosis not present

## 2024-05-11 DIAGNOSIS — F3181 Bipolar II disorder: Secondary | ICD-10-CM

## 2024-05-11 LAB — QUANTIFERON-TB GOLD PLUS
QuantiFERON Mitogen Value: >10 [IU]/mL
QuantiFERON Nil Value: 0.03 [IU]/mL
QuantiFERON TB1 Ag Value: 0.03 [IU]/mL
QuantiFERON TB2 Ag Value: 0.01 [IU]/mL
QuantiFERON-TB Gold Plus: NEGATIVE

## 2024-05-11 MED ORDER — QUETIAPINE FUMARATE 50 MG PO TABS: 50.0000 mg | ORAL_TABLET | Freq: Every day | ORAL | 0 refills | Status: AC

## 2024-05-11 NOTE — Progress Notes (Unsigned)
 [facility] MD/PA/NP OP Progress Note  05/11/2024 2:11 PM Cindy Brock  MRN:  [id number]  Chief Complaint:  Chief Complaint  Patient presents with   Follow-up   HPI: 28 year old female presenting [facility] for follow-up.  Patient reports that she did not come back after a 2-week follow-up in April and states that she thought she could get on her own and states she has been off of medications ever since.  Patient reports that she is having a significant amount of stress with financial spending as well as impulsiveness and irritability and poor sleep in which she stated she realized that she can go without the medication.  Patient has been recommended to start Seroquel 50 mg once daily due to her not being able to sleep and due to presentation of bipolar disorder.  Patient has been educated that consistency is going to be the biggest key and to symptom relief and has asked if she will stay compliant with medications at this time.  Patient has verbalized understanding and agreement and states that she wants to feel better.  Based on this assessment and review patient will continue the medications of hydroxyzine  10 mg up to 3 times a day for anxiety and that she will also start Seroquel 50 mg once daily and we will be readjusted accordingly.  Patient will follow-up in 2 weeks.  Patient denies SI, HI, AVH.  Patient with no other question concerns and is in agreement with treatment plan. Visit Diagnosis:    ICD-10-CM   1. Bipolar 2 disorder (HCC)  F31.81     2. Generalized anxiety disorder  F41.1       Past Psychiatric History:   Previous Psych Hospitalizations: 17, hospitalized for suicidal ideation, none since   Outpatient treatment: Currently being managed by Dr. Ziglar and may have been for PCP.  No active psychiatrist.   Medications Current: -Klonopin , which she reports she stopped taking -Mirtazapine , taking for sleep by PCP   Medication Trials: -No previous medication trials   Suicide & Violence:  Denies SI, HI.  Denies AVH   Psychotherapy: No psychotherapy   Legal: Denies  Past Medical History:  Past Medical History:  Diagnosis Date   Asthma    Hyperthyroidism    Thyroid  disease     Past Surgical History:  Procedure Laterality Date   NO PAST SURGERIES      Family Psychiatric History: No additional  Family History:  Family History  Problem Relation Age of Onset   Rheum arthritis Mother    Hypothyroidism Maternal Grandmother     Social History:  Social History   Socioeconomic History   Marital status: Married    Spouse name: Chevaughn   Number of children: 4   Years of education: Not on file   Highest education level: High school graduate  Occupational History   Not on file  Tobacco Use   Smoking status: Some Days    Current packs/day: 0.25    Types: Cigarettes    Passive exposure: Current   Smokeless tobacco: Never  Vaping Use   Vaping status: Never Used  Substance and Sexual Activity   Alcohol use: Not Currently    Comment: social    Drug use: Not Currently   Sexual activity: Yes    Birth control/protection: I.U.D.    Comment: undecided  Other Topics Concern   Not on file  Social History Narrative   Not on file   Social Drivers of Health   Financial Resource Strain: Not on file  Food Insecurity: Not on file  Transportation Needs: Not on file  Physical Activity: Not on file  Stress: Not on file  Social Connections: Not on file    Allergies: No Known Allergies  Metabolic Disorder Labs: Lab Results  Component Value Date   HGBA1C 5.5 10/03/2019   No results found for: PROLACTIN No results found for: CHOL, TRIG, HDL, CHOLHDL, VLDL, LDLCALC Lab Results  Component Value Date   TSH 0.317 (L) 07/24/2023   TSH 0.301 (L) 07/24/2023    Therapeutic Level Labs: No results found for: LITHIUM No results found for: VALPROATE No results found for: CBMZ  Current Medications: Current Outpatient Medications  Medication  Sig Dispense Refill   albuterol  (VENTOLIN  HFA) 108 (90 Base) MCG/ACT inhaler Inhale 1-2 puffs into the lungs every 6 (six) hours as needed. 8 g 0   ARIPiprazole  (ABILIFY ) 5 MG tablet Take 1 tablet (5 mg total) by mouth daily. (Patient not taking: Reported on 05/11/2024) 30 tablet 0   clonazePAM  (KLONOPIN ) 0.5 MG tablet Take 1 tablet (0.5 mg total) by mouth 2 (two) times daily as needed for anxiety. (Patient not taking: Reported on 05/11/2024) 30 tablet 0   hydrOXYzine  (ATARAX ) 10 MG tablet Take 1 tablet (10 mg total) by mouth 3 (three) times daily as needed. (Patient not taking: Reported on 05/11/2024) 30 tablet 0   mirtazapine  (REMERON  SOL-TAB) 15 MG disintegrating tablet Take 1 tablet (15 mg total) by mouth at bedtime. (Patient not taking: Reported on 05/11/2024) 30 tablet 3   No current facility-administered medications for this visit.     Musculoskeletal: Strength & Muscle Tone: within normal limits Gait & Station: normal Patient leans: N/A   Psychiatric Specialty Exam: Review of Systems  Constitutional: Negative.   HENT: Negative.    Eyes: Negative.   Respiratory: Negative.    Cardiovascular: Negative.   Gastrointestinal: Negative.   Endocrine: Negative.   Genitourinary: Negative.   Musculoskeletal: Negative.   Skin: Negative.   Allergic/Immunologic: Negative.   Neurological: Negative.   Hematological: Negative.   Psychiatric/Behavioral:  Positive for behavioral problems. The patient is nervous/anxious and is hyperactive.      Today's Vitals   05/11/24 1404  BP: 120/70  Pulse: 99  Temp: 98.3 F (36.8 C)  TempSrc: Temporal  Weight: 134 lb 6.4 oz (61 kg)  Height: 5' 6 (1.676 m)   Body mass index is 21.69 kg/m.   General Appearance: Well Groomed  Eye Contact:  Good  Speech:  Clear and Coherent  Volume:  Normal  Mood:  Euphoric  Affect:  Congruent  Thought Process:  Coherent  Orientation:  Full (Time, Place, and Person)  Thought Content:  Logical  Suicidal  Thoughts:  No  Homicidal Thoughts:  No  Memory:  Immediate;   Good Recent;   Good Remote;   Good  Judgement:  Good  Insight:  Good  Psychomotor Activity:  Normal  Concentration:  Concentration: Good and Attention Span: Good  Recall:  Good  Fund of Knowledge:Good  Language: Good  Akathisia:  No  Handed:  Right  AIMS (if indicated):    Assets:  Desire for Improvement Social Support Vocational/Educational  ADL's:  Intact  Cognition: WNL  Sleep:  Good   Screenings: GAD-7    Flowsheet Row Office Visit from 05/09/2024 in [facility] Visit from 07/24/2023 in [facility] at [facility] Visit from 01/02/2020 in [facility]  Total GAD-7 Score 21 20 0   PHQ2-9  Flowsheet Row Office Visit from 05/09/2024 in [facility] at [facility] Visit from 09/14/2023 in [facility] Office Visit from 07/24/2023 in [facility] at [facility] Routine Prenatal from 09/24/2020 in [facility] Routine Prenatal from 08/23/2020 in [facility]  PHQ-2 Total Score 0 4 6 0 0  PHQ-9 Total Score 8 19 27  -- --   Flowsheet Row ED from 10/23/2023 in [facility] at [facility] Visit from 09/14/2023 in [facility] ED from 07/24/2023 in [facility] Emergency Department at [facility]  C-SSRS RISK CATEGORY No Risk No Risk No Risk     Assessment and Plan:  Assessment - Diagnosis: Bipolar 2, currently hypomanic Differential Diagnosis: Bipolar 2 2. Anxiety  - Risk Factors:   Plan - Medications:  Start Seroquel 50mg  once daily for bipolar and sleep.  Continue Hydroxyzine  10mg  up to 3 times a day for anxiety.  - Psychotherapy: None recommended at this time - Education: Patient educated on bipolar disorder and has been educated on the importance of sleep as well as the purpose of Abilify  with  bipolar disorder..  Patient is currently experiencing hypomanic episodes and patient has been educated on management with sleep schedule as well as exercise.  Patient is in agreement - Follow-Up: Patient will follow up in 2 weeks for medication monitoring and adjustment. - Referrals: No referrals - Safety Planning:  The patient has been educated, if they should have suicidal thoughts with or without a plan to call 911, or go to the closest emergency department.  Pt verbalized understanding.  Pt denies firearms within the home.  Pt also agrees to call the clinic should they have worsening symptoms before the next appointment.    Patient/Guardian was advised Release of Information must be obtained prior to any record release in order to collaborate their care with an outside provider. Patient/Guardian was advised if they have not already done so to contact the registration department to sign all necessary forms in order for us  to release information regarding their care.   Consent: Patient/Guardian gives verbal consent for treatment and assignment of benefits for services provided during this visit. Patient/Guardian expressed understanding and agreed to proceed.    Dorn Jama Der, NP 05/11/2024, 2:11 PM

## 2024-05-13 ENCOUNTER — Ambulatory Visit: Payer: Self-pay | Admitting: Family Medicine

## 2024-05-15 ENCOUNTER — Other Ambulatory Visit: Payer: Self-pay | Admitting: Medical Genetics

## 2024-05-24 ENCOUNTER — Ambulatory Visit: Admitting: Psychiatry

## 2024-06-08 ENCOUNTER — Ambulatory Visit: Admitting: Family Medicine
